# Patient Record
Sex: Female | Born: 1940 | Race: White | Hispanic: No | Marital: Married | Smoking: Current every day smoker
Health system: Southern US, Community
[De-identification: ages and names within clinical notes are randomized; demographics above are authoritative.]

## PROBLEM LIST (undated history)

## (undated) DIAGNOSIS — R112 Nausea with vomiting, unspecified: Secondary | ICD-10-CM

## (undated) DIAGNOSIS — I739 Peripheral vascular disease, unspecified: Secondary | ICD-10-CM

## (undated) DIAGNOSIS — R600 Localized edema: Secondary | ICD-10-CM

## (undated) DIAGNOSIS — T4145XA Adverse effect of unspecified anesthetic, initial encounter: Secondary | ICD-10-CM

## (undated) DIAGNOSIS — IMO0001 Reserved for inherently not codable concepts without codable children: Secondary | ICD-10-CM

## (undated) DIAGNOSIS — N3941 Urge incontinence: Secondary | ICD-10-CM

## (undated) DIAGNOSIS — E785 Hyperlipidemia, unspecified: Secondary | ICD-10-CM

## (undated) DIAGNOSIS — J449 Chronic obstructive pulmonary disease, unspecified: Secondary | ICD-10-CM

## (undated) DIAGNOSIS — Z5189 Encounter for other specified aftercare: Secondary | ICD-10-CM

## (undated) DIAGNOSIS — I10 Essential (primary) hypertension: Secondary | ICD-10-CM

## (undated) DIAGNOSIS — T7840XA Allergy, unspecified, initial encounter: Secondary | ICD-10-CM

## (undated) DIAGNOSIS — R21 Rash and other nonspecific skin eruption: Secondary | ICD-10-CM

## (undated) DIAGNOSIS — M1990 Unspecified osteoarthritis, unspecified site: Secondary | ICD-10-CM

## (undated) DIAGNOSIS — C259 Malignant neoplasm of pancreas, unspecified: Secondary | ICD-10-CM

## (undated) DIAGNOSIS — I6529 Occlusion and stenosis of unspecified carotid artery: Secondary | ICD-10-CM

## (undated) DIAGNOSIS — Z9049 Acquired absence of other specified parts of digestive tract: Secondary | ICD-10-CM

## (undated) DIAGNOSIS — E0590 Thyrotoxicosis, unspecified without thyrotoxic crisis or storm: Secondary | ICD-10-CM

## (undated) DIAGNOSIS — Z9889 Other specified postprocedural states: Secondary | ICD-10-CM

## (undated) DIAGNOSIS — I779 Disorder of arteries and arterioles, unspecified: Secondary | ICD-10-CM

## (undated) DIAGNOSIS — T8859XA Other complications of anesthesia, initial encounter: Secondary | ICD-10-CM

## (undated) HISTORY — DX: Thyrotoxicosis, unspecified without thyrotoxic crisis or storm: E05.90

## (undated) HISTORY — DX: Chronic obstructive pulmonary disease, unspecified: J44.9

## (undated) HISTORY — DX: Hyperlipidemia, unspecified: E78.5

## (undated) HISTORY — DX: Occlusion and stenosis of unspecified carotid artery: I65.29

## (undated) HISTORY — DX: Localized edema: R60.0

## (undated) HISTORY — DX: Acquired absence of other specified parts of digestive tract: Z90.49

## (undated) HISTORY — DX: Other specified postprocedural states: Z98.890

## (undated) HISTORY — PX: TONSILLECTOMY: SUR1361

## (undated) HISTORY — DX: Hypercalcemia: E83.52

## (undated) HISTORY — DX: Urge incontinence: N39.41

## (undated) HISTORY — DX: Disorder of arteries and arterioles, unspecified: I77.9

## (undated) HISTORY — DX: Peripheral vascular disease, unspecified: I73.9

## (undated) HISTORY — PX: EYE SURGERY: SHX253

## (undated) HISTORY — DX: Unspecified osteoarthritis, unspecified site: M19.90

## (undated) HISTORY — DX: Essential (primary) hypertension: I10

## (undated) HISTORY — PX: TUBAL LIGATION: SHX77

---
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
## 2000-11-27 ENCOUNTER — Other Ambulatory Visit: Admission: RE | Admit: 2000-11-27 | Discharge: 2000-11-27 | Payer: Self-pay | Admitting: Internal Medicine

## 2001-11-02 ENCOUNTER — Encounter: Admission: RE | Admit: 2001-11-02 | Discharge: 2002-01-31 | Payer: Self-pay | Admitting: Internal Medicine

## 2003-06-06 ENCOUNTER — Encounter: Admission: RE | Admit: 2003-06-06 | Discharge: 2003-06-06 | Payer: Self-pay | Admitting: Family Medicine

## 2003-07-03 ENCOUNTER — Encounter: Admission: RE | Admit: 2003-07-03 | Discharge: 2003-10-01 | Payer: Self-pay | Admitting: Internal Medicine

## 2004-07-10 ENCOUNTER — Ambulatory Visit: Payer: Self-pay | Admitting: Internal Medicine

## 2004-08-06 ENCOUNTER — Ambulatory Visit: Payer: Self-pay | Admitting: Internal Medicine

## 2004-10-07 ENCOUNTER — Ambulatory Visit: Payer: Self-pay | Admitting: Internal Medicine

## 2004-12-12 ENCOUNTER — Ambulatory Visit: Payer: Self-pay | Admitting: Internal Medicine

## 2004-12-24 ENCOUNTER — Ambulatory Visit: Payer: Self-pay | Admitting: Internal Medicine

## 2005-02-10 ENCOUNTER — Ambulatory Visit: Payer: Self-pay | Admitting: Internal Medicine

## 2005-02-17 ENCOUNTER — Ambulatory Visit: Payer: Self-pay | Admitting: Internal Medicine

## 2005-07-16 ENCOUNTER — Ambulatory Visit: Payer: Self-pay | Admitting: Internal Medicine

## 2005-07-23 ENCOUNTER — Ambulatory Visit: Payer: Self-pay | Admitting: Internal Medicine

## 2005-08-06 ENCOUNTER — Ambulatory Visit: Payer: Self-pay

## 2005-09-05 ENCOUNTER — Ambulatory Visit: Payer: Self-pay | Admitting: Internal Medicine

## 2005-09-16 ENCOUNTER — Ambulatory Visit: Payer: Self-pay | Admitting: Internal Medicine

## 2006-01-02 ENCOUNTER — Ambulatory Visit: Payer: Self-pay | Admitting: Internal Medicine

## 2006-01-02 LAB — CONVERTED CEMR LAB
ALT: 26 units/L (ref 0–40)
Chol/HDL Ratio, serum: 2.9
Cholesterol: 144 mg/dL (ref 0–200)
TSH: 1.44 microintl units/mL (ref 0.35–5.50)
VLDL: 21 mg/dL (ref 0–40)

## 2006-01-09 ENCOUNTER — Ambulatory Visit: Payer: Self-pay | Admitting: Internal Medicine

## 2006-03-24 ENCOUNTER — Ambulatory Visit: Payer: Self-pay | Admitting: Gastroenterology

## 2006-04-08 ENCOUNTER — Ambulatory Visit: Payer: Self-pay | Admitting: Gastroenterology

## 2006-05-15 ENCOUNTER — Ambulatory Visit: Payer: Self-pay | Admitting: Internal Medicine

## 2006-05-15 LAB — CONVERTED CEMR LAB: Hgb A1c MFr Bld: 7.1 % — ABNORMAL HIGH (ref 4.6–6.0)

## 2006-05-22 ENCOUNTER — Ambulatory Visit: Payer: Self-pay | Admitting: Internal Medicine

## 2006-09-08 ENCOUNTER — Ambulatory Visit: Payer: Self-pay | Admitting: Internal Medicine

## 2006-09-08 LAB — CONVERTED CEMR LAB
CO2: 32 meq/L (ref 19–32)
Chloride: 111 meq/L (ref 96–112)
Creatinine, Ser: 0.6 mg/dL (ref 0.4–1.2)
GFR calc non Af Amer: 107 mL/min
Glucose, Bld: 106 mg/dL — ABNORMAL HIGH (ref 70–99)
Potassium: 4.2 meq/L (ref 3.5–5.1)
Sodium: 145 meq/L (ref 135–145)

## 2006-09-15 ENCOUNTER — Ambulatory Visit: Payer: Self-pay | Admitting: Internal Medicine

## 2006-09-18 DIAGNOSIS — E785 Hyperlipidemia, unspecified: Secondary | ICD-10-CM

## 2006-09-18 DIAGNOSIS — R0989 Other specified symptoms and signs involving the circulatory and respiratory systems: Secondary | ICD-10-CM | POA: Insufficient documentation

## 2006-09-18 DIAGNOSIS — I739 Peripheral vascular disease, unspecified: Secondary | ICD-10-CM | POA: Insufficient documentation

## 2006-10-16 ENCOUNTER — Ambulatory Visit: Payer: Self-pay | Admitting: Internal Medicine

## 2006-10-16 ENCOUNTER — Encounter: Payer: Self-pay | Admitting: Internal Medicine

## 2006-11-27 ENCOUNTER — Encounter: Payer: Self-pay | Admitting: Internal Medicine

## 2007-01-05 ENCOUNTER — Ambulatory Visit: Payer: Self-pay | Admitting: Vascular Surgery

## 2007-01-05 ENCOUNTER — Encounter: Payer: Self-pay | Admitting: Internal Medicine

## 2007-01-15 ENCOUNTER — Ambulatory Visit: Payer: Self-pay | Admitting: Internal Medicine

## 2007-01-18 LAB — CONVERTED CEMR LAB
Calcium: 10.5 mg/dL (ref 8.4–10.5)
Chloride: 102 meq/L (ref 96–112)
Cholesterol: 158 mg/dL (ref 0–200)
Creatinine, Ser: 0.6 mg/dL (ref 0.4–1.2)
HDL: 51.2 mg/dL (ref >39.0)
Triglycerides: 107 mg/dL (ref 0–149)
VLDL: 21 mg/dL (ref 0–40)

## 2007-01-22 ENCOUNTER — Ambulatory Visit: Payer: Self-pay | Admitting: Internal Medicine

## 2007-01-22 DIAGNOSIS — F17200 Nicotine dependence, unspecified, uncomplicated: Secondary | ICD-10-CM | POA: Insufficient documentation

## 2007-01-22 DIAGNOSIS — E119 Type 2 diabetes mellitus without complications: Secondary | ICD-10-CM | POA: Insufficient documentation

## 2007-01-22 DIAGNOSIS — I10 Essential (primary) hypertension: Secondary | ICD-10-CM | POA: Insufficient documentation

## 2007-01-22 LAB — CONVERTED CEMR LAB
Cholesterol, target level: 200 mg/dL
HDL goal, serum: 40 mg/dL
LDL Goal: 70 mg/dL

## 2007-01-25 ENCOUNTER — Telehealth: Payer: Self-pay | Admitting: *Deleted

## 2007-05-14 ENCOUNTER — Ambulatory Visit: Payer: Self-pay | Admitting: Internal Medicine

## 2007-05-14 LAB — CONVERTED CEMR LAB: Vit D, 1,25-Dihydroxy: 49

## 2007-05-20 LAB — CONVERTED CEMR LAB
Hgb A1c MFr Bld: 6.6 % — ABNORMAL HIGH (ref 4.6–6.0)
Microalb Creat Ratio: 5.6 mg/g (ref 0.0–30.0)
Microalb, Ur: 0.8 mg/dL (ref 0.0–1.9)

## 2007-05-28 ENCOUNTER — Ambulatory Visit: Payer: Self-pay | Admitting: Internal Medicine

## 2007-05-28 DIAGNOSIS — M25569 Pain in unspecified knee: Secondary | ICD-10-CM

## 2007-06-29 ENCOUNTER — Ambulatory Visit: Payer: Self-pay | Admitting: Vascular Surgery

## 2007-09-22 ENCOUNTER — Ambulatory Visit: Payer: Self-pay | Admitting: Internal Medicine

## 2007-09-22 LAB — CONVERTED CEMR LAB
Chloride: 104 meq/L (ref 96–112)
Creatinine,U: 67.4 mg/dL
Hgb A1c MFr Bld: 6.8 % — ABNORMAL HIGH (ref 4.6–6.0)
Microalb Creat Ratio: 3 mg/g (ref 0.0–30.0)
Microalb, Ur: 0.2 mg/dL (ref 0.0–1.9)
Sodium: 144 meq/L (ref 135–145)
TSH: 1.5 microintl units/mL (ref 0.35–5.50)

## 2007-09-29 ENCOUNTER — Ambulatory Visit: Payer: Self-pay | Admitting: Internal Medicine

## 2007-09-29 DIAGNOSIS — B009 Herpesviral infection, unspecified: Secondary | ICD-10-CM | POA: Insufficient documentation

## 2007-10-04 ENCOUNTER — Encounter: Payer: Self-pay | Admitting: Internal Medicine

## 2007-11-22 ENCOUNTER — Telehealth: Payer: Self-pay | Admitting: Internal Medicine

## 2007-12-08 ENCOUNTER — Encounter: Payer: Self-pay | Admitting: Internal Medicine

## 2007-12-16 ENCOUNTER — Encounter: Payer: Self-pay | Admitting: Internal Medicine

## 2007-12-31 ENCOUNTER — Ambulatory Visit: Payer: Self-pay | Admitting: Internal Medicine

## 2007-12-31 LAB — CONVERTED CEMR LAB
ALT: 30 units/L (ref 0–35)
AST: 21 units/L (ref 0–37)
Albumin: 4.1 g/dL (ref 3.5–5.2)
Alkaline Phosphatase: 68 units/L (ref 39–117)
Bilirubin, Direct: 0.1 mg/dL (ref 0.0–0.3)
Cholesterol: 161 mg/dL (ref 0–200)
HDL: 50 mg/dL (ref >39.0)
Hgb A1c MFr Bld: 6.8 % — ABNORMAL HIGH (ref 4.6–6.0)
LDL Cholesterol: 91 mg/dL (ref 0–99)
Total Bilirubin: 0.8 mg/dL (ref 0.3–1.2)
Total CHOL/HDL Ratio: 3.2
Total Protein: 6.8 g/dL (ref 6.0–8.3)
Triglycerides: 101 mg/dL (ref 0–149)
VLDL: 20 mg/dL (ref 0–40)

## 2008-01-04 ENCOUNTER — Ambulatory Visit: Payer: Self-pay | Admitting: Vascular Surgery

## 2008-01-20 ENCOUNTER — Ambulatory Visit: Payer: Self-pay | Admitting: Internal Medicine

## 2008-01-21 ENCOUNTER — Telehealth: Payer: Self-pay | Admitting: *Deleted

## 2008-05-19 ENCOUNTER — Ambulatory Visit: Payer: Self-pay | Admitting: Internal Medicine

## 2008-05-19 LAB — CONVERTED CEMR LAB: Hgb A1c MFr Bld: 6.8 % — ABNORMAL HIGH (ref 4.6–6.0)

## 2008-05-26 ENCOUNTER — Ambulatory Visit: Payer: Self-pay | Admitting: Internal Medicine

## 2008-05-26 LAB — CONVERTED CEMR LAB: Blood Glucose, Fingerstick: 169

## 2008-06-01 ENCOUNTER — Telehealth: Payer: Self-pay | Admitting: Internal Medicine

## 2008-06-27 ENCOUNTER — Ambulatory Visit: Payer: Self-pay | Admitting: Vascular Surgery

## 2008-11-14 ENCOUNTER — Ambulatory Visit: Payer: Self-pay | Admitting: Internal Medicine

## 2008-11-14 ENCOUNTER — Encounter: Payer: Self-pay | Admitting: Internal Medicine

## 2008-11-15 ENCOUNTER — Ambulatory Visit: Payer: Self-pay | Admitting: Internal Medicine

## 2008-11-15 LAB — CONVERTED CEMR LAB
ALT: 28 units/L (ref 0–35)
Albumin: 4.1 g/dL (ref 3.5–5.2)
Basophils Absolute: 0 10*3/uL (ref 0.0–0.1)
Basophils Relative: 0.8 % (ref 0.0–3.0)
Bilirubin, Direct: 0 mg/dL (ref 0.0–0.3)
CO2: 32 meq/L (ref 19–32)
Chloride: 104 meq/L (ref 96–112)
Eosinophils Absolute: 0.2 10*3/uL (ref 0.0–0.7)
Eosinophils Relative: 4.6 % (ref 0.0–5.0)
GFR calc non Af Amer: 88.46 mL/min (ref >60)
HCT: 40.6 % (ref 36.0–46.0)
Hgb A1c MFr Bld: 6.6 % — ABNORMAL HIGH (ref 4.6–6.5)
LDL Cholesterol: 89 mg/dL (ref 0–99)
Lymphs Abs: 1.3 10*3/uL (ref 0.7–4.0)
MCHC: 34.1 g/dL (ref 30.0–36.0)
Neutro Abs: 2.9 10*3/uL (ref 1.4–7.7)
Sodium: 142 meq/L (ref 135–145)
TSH: 1.25 microintl units/mL (ref 0.35–5.50)
Total Bilirubin: 0.8 mg/dL (ref 0.3–1.2)
Total CHOL/HDL Ratio: 3
Total Protein: 6.5 g/dL (ref 6.0–8.3)
VLDL: 17.2 mg/dL (ref 0.0–40.0)

## 2008-11-17 ENCOUNTER — Telehealth: Payer: Self-pay | Admitting: Internal Medicine

## 2008-12-20 ENCOUNTER — Encounter: Payer: Self-pay | Admitting: Internal Medicine

## 2008-12-26 ENCOUNTER — Ambulatory Visit: Payer: Self-pay | Admitting: Internal Medicine

## 2009-01-02 ENCOUNTER — Ambulatory Visit: Payer: Self-pay | Admitting: Vascular Surgery

## 2009-06-19 ENCOUNTER — Ambulatory Visit: Payer: Self-pay | Admitting: Internal Medicine

## 2009-06-19 LAB — CONVERTED CEMR LAB: Hgb A1c MFr Bld: 7.4 % — ABNORMAL HIGH (ref 4.6–6.5)

## 2009-06-26 ENCOUNTER — Ambulatory Visit: Payer: Self-pay | Admitting: Internal Medicine

## 2009-06-27 ENCOUNTER — Encounter: Payer: Self-pay | Admitting: Internal Medicine

## 2009-06-27 ENCOUNTER — Ambulatory Visit: Payer: Self-pay | Admitting: Vascular Surgery

## 2009-09-25 ENCOUNTER — Ambulatory Visit: Payer: Self-pay | Admitting: Internal Medicine

## 2009-10-02 ENCOUNTER — Ambulatory Visit: Payer: Self-pay | Admitting: Internal Medicine

## 2009-12-26 ENCOUNTER — Encounter: Payer: Self-pay | Admitting: Internal Medicine

## 2010-01-30 ENCOUNTER — Ambulatory Visit: Payer: Self-pay | Admitting: Internal Medicine

## 2010-01-30 LAB — CONVERTED CEMR LAB
ALT: 15 units/L (ref 0–35)
Albumin: 4.5 g/dL (ref 3.5–5.2)
BUN: 23 mg/dL (ref 6–23)
Basophils Relative: 0.6 % (ref 0.0–3.0)
Bilirubin, Direct: 0.1 mg/dL (ref 0.0–0.3)
Creatinine, Ser: 0.74 mg/dL (ref 0.40–1.20)
Eosinophils Relative: 3.7 % (ref 0.0–5.0)
HDL: 52 mg/dL (ref >39)
Lymphocytes Relative: 28 % (ref 12.0–46.0)
Lymphs Abs: 1.9 10*3/uL (ref 0.7–4.0)
MCV: 93.3 fL (ref 78.0–100.0)
Monocytes Absolute: 0.6 10*3/uL (ref 0.1–1.0)
Monocytes Relative: 8.9 % (ref 3.0–12.0)
RBC: 4.41 M/uL (ref 3.87–5.11)
Total Bilirubin: 0.6 mg/dL (ref 0.3–1.2)
Total CHOL/HDL Ratio: 3.3
Triglycerides: 161 mg/dL — ABNORMAL HIGH (ref <150)
VLDL: 32 mg/dL (ref 0–40)
WBC: 6.9 10*3/uL (ref 4.5–10.5)

## 2010-02-06 ENCOUNTER — Ambulatory Visit: Payer: Self-pay | Admitting: Internal Medicine

## 2010-02-06 DIAGNOSIS — M899 Disorder of bone, unspecified: Secondary | ICD-10-CM | POA: Insufficient documentation

## 2010-02-06 DIAGNOSIS — M949 Disorder of cartilage, unspecified: Secondary | ICD-10-CM

## 2010-03-25 ENCOUNTER — Encounter: Payer: Self-pay | Admitting: Gastroenterology
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
## 2010-04-11 NOTE — Letter (Signed)
Summary: Colonoscopy Date Change Letter  [facility]  [address] [place], [place] [address]   Phone: [phone]  Fax: [phone]      March 25, 2010 MRN: [id number]   REBBECCA OSUNA [address] [place], [place]  [address]   Dear Ms. Chlebowski,   Previously you were recommended to have a repeat colonoscopy around this time. Your chart was recently reviewed by DR. KAPLAN of [facility]. Follow up colonoscopy is now recommended in 03-2016. This revised recommendation is based on current, nationally recognized guidelines for colorectal cancer screening and polyp surveillance. These guidelines are endorsed by the American Cancer Society, [employer] on Colorectal Cancer as well as numerous other major medical organizations.  Please understand that our recommendation assumes that you do not have any new symptoms such as bleeding, a change in bowel habits, anemia, or significant abdominal discomfort. If you do have any concerning GI symptoms or want to discuss the guideline recommendations, please call to arrange an office visit at your earliest convenience. Otherwise we will keep you in our reminder system and contact you 1-2 months prior to the date listed above to schedule your next colonoscopy.  Thank you,  Barbette Hair. Arlyce Dice, M.D.  [facility] [phone]

## 2010-04-29 ENCOUNTER — Telehealth: Payer: Self-pay | Admitting: Internal Medicine

## 2010-04-29 NOTE — Telephone Encounter (Signed)
Pt has a cold for about 2 wk she is requesting abx call into [facility] [phone]

## 2010-04-30 ENCOUNTER — Telehealth: Payer: Self-pay | Admitting: *Deleted

## 2010-04-30 NOTE — Telephone Encounter (Signed)
Called pt to call back for an appt for an URI>

## 2010-04-30 NOTE — Telephone Encounter (Signed)
Pt has had sinus complaints x 10 days.

## 2010-04-30 NOTE — Telephone Encounter (Signed)
Per Dr. Fabian Sharp- Sounds viral, How long has she had these symptoms, if she gets a fever, severe pain or not improving after 10-14 days the needs a office visit for evaluation.

## 2010-04-30 NOTE — Telephone Encounter (Signed)
Runny nose, scratchy throat, no cough, no drainage.  Has been taking Sudafed with now relief. [facility] [place].  No fever. Wants antibiotic and no appt.

## 2010-04-30 NOTE — Telephone Encounter (Signed)
As in notes, symptoms have been going on x 10 days.  No facial pain or fever.......just symptoms in previous notes.  Does not want an office visit. Do you want any treatment?????

## 2010-05-14 NOTE — Telephone Encounter (Signed)
This call was 04/29/2010, and she is well.

## 2010-05-18 ENCOUNTER — Other Ambulatory Visit: Payer: Self-pay | Admitting: Internal Medicine

## 2010-05-21 ENCOUNTER — Telehealth: Payer: Self-pay | Admitting: Internal Medicine

## 2010-05-21 NOTE — Telephone Encounter (Signed)
Appt made

## 2010-05-21 NOTE — Telephone Encounter (Signed)
Left message to call back  

## 2010-05-21 NOTE — Telephone Encounter (Signed)
Attempted to speak to pt about her symptoms, and she informed me she has spoken to 3 people here, and if she died, she would just die.  Very upset and hung up.  Attempted to reason with her to no avail.

## 2010-05-21 NOTE — Telephone Encounter (Signed)
Unsure what i should do with this message   . Ok to work her in tomorrow am at   9 30 if she wants to come.

## 2010-05-21 NOTE — Telephone Encounter (Signed)
Pt req work in appt for Dr Fabian Sharp tomorrow re: cough/chest and head congestion. Pls advise.

## 2010-05-22 ENCOUNTER — Ambulatory Visit (INDEPENDENT_AMBULATORY_CARE_PROVIDER_SITE_OTHER): Payer: PRIVATE HEALTH INSURANCE | Admitting: Internal Medicine

## 2010-05-22 ENCOUNTER — Encounter: Payer: Self-pay | Admitting: Internal Medicine

## 2010-05-22 VITALS — BP 140/70 | HR 74 | Temp 97.9°F | Wt 138.0 lb

## 2010-05-22 DIAGNOSIS — F17200 Nicotine dependence, unspecified, uncomplicated: Secondary | ICD-10-CM

## 2010-05-22 DIAGNOSIS — J209 Acute bronchitis, unspecified: Secondary | ICD-10-CM

## 2010-05-22 DIAGNOSIS — J449 Chronic obstructive pulmonary disease, unspecified: Secondary | ICD-10-CM

## 2010-05-22 DIAGNOSIS — J019 Acute sinusitis, unspecified: Secondary | ICD-10-CM

## 2010-05-22 DIAGNOSIS — E785 Hyperlipidemia, unspecified: Secondary | ICD-10-CM

## 2010-05-22 DIAGNOSIS — I779 Disorder of arteries and arterioles, unspecified: Secondary | ICD-10-CM | POA: Insufficient documentation

## 2010-05-22 DIAGNOSIS — I739 Peripheral vascular disease, unspecified: Secondary | ICD-10-CM | POA: Insufficient documentation

## 2010-05-22 MED ORDER — HYDROCODONE-HOMATROPINE 5-1.5 MG/5ML PO SYRP: 5.0000 mL | ORAL_SOLUTION | ORAL | Status: AC | PRN

## 2010-05-22 MED ORDER — AMOXICILLIN-POT CLAVULANATE 875-125 MG PO TABS: 1.0000 | ORAL_TABLET | Freq: Two times a day (BID) | ORAL | Status: AC

## 2010-05-22 NOTE — Progress Notes (Signed)
  Subjective:    Patient ID: Jordan [name], female    DOB: 02-23-1941, 70 y.o.   MRN: [id number]  HPI  patient comes in today for an acute visit see phone notes. She had the onset of what was like a head cold about 4 weeks ago that has waxed and waned since that time. She currently has a mild headache but no face pain but has significant nasal congestion and discharge. A week ago she began to have a cough that became deeper and wheezy like she denies chills or shortness of breath at present. No hemoptysis the phlegm is dark gray and thick at times. Her coughing increases at night.  Past Medical History  Diagnosis Date  . Hyperlipidemia   . COPD (chronic obstructive pulmonary disease)     PFT's 2005 mod reversible defect  . Leg edema   . PVD (peripheral vascular disease)     claudication ABI.60 repeat 2011 left .84  . Urge incontinence   . Carotid arterial disease     left 60-79% fall 2009 left repeat 2011 same category   No past surgical history on file.  reports that she has been smoking.  She does not have any smokeless tobacco history on file. She reports that she drinks alcohol. Her drug history not on file. family history includes Colon polyps in her brother and mother. Allergies  Allergen Reactions  . Prednisone     Review of Systems  no chest pain shortness of breath otherwise change in health no one is sick at home. No history of seasonal allergies in the past. No Gi sx rest [place] see hpi    Objective:   Physical Exam  well-developed well-nourished in no acute distress very congested in the head with an occasional deep bronchial cough. HEENT: Normocephalic ;atraumatic , Eyes;  PERRL, EOMs  Full, lids and conjunctiva clear,,Ears: no deformities, canals nl, TM landmarks normal, Nose: 2 + congested   Mucoid dc face non tender Mouth : OP clear without lesion or edema . Neck no masses  Chest:  Clear to A&P without wheezes rales or rhonchi ? Slight dec BS  CV:  S1-S2 no gallops  or murmurs peripheral perfusion is normal No clubbing cyanosis or edema  Neuro no focal      Assessment & Plan:   Prolonged upper respiratory infection sinusitis and secondary bronchitis.  Tobacco and mild COPD.   Reviewed treatment expectant management antibiotic cough medicine as needed and to followup with alarm findings or persistent symptoms. Chest x-ray not needed at this time.

## 2010-05-22 NOTE — Patient Instructions (Signed)
Take antibiotic for sinusitis bronchitis infection Cough med for comfort  Can take 2 tsp 10 cc  if needed Stop tobacco Expect improvement in the next 5 days  . Call if not improving by then or prn.

## 2010-07-04 ENCOUNTER — Ambulatory Visit: Payer: Self-pay | Admitting: Vascular Surgery

## 2010-07-04 ENCOUNTER — Other Ambulatory Visit: Payer: Self-pay

## 2010-07-17 ENCOUNTER — Other Ambulatory Visit (INDEPENDENT_AMBULATORY_CARE_PROVIDER_SITE_OTHER): Payer: PRIVATE HEALTH INSURANCE | Admitting: Internal Medicine

## 2010-07-17 DIAGNOSIS — E119 Type 2 diabetes mellitus without complications: Secondary | ICD-10-CM

## 2010-07-17 LAB — MICROALBUMIN / CREATININE URINE RATIO
Creatinine,U: 47.9 mg/dL
Microalb Creat Ratio: 2.1 mg/g (ref 0.0–30.0)

## 2010-07-23 ENCOUNTER — Encounter: Payer: Self-pay | Admitting: Internal Medicine

## 2010-07-23 NOTE — Procedures (Signed)
CAROTID DUPLEX EXAM   INDICATION:  Followup carotid artery disease.   HISTORY:  Diabetes:  Yes.  Cardiac:  No.  Hypertension:  Yes.  Smoking:  Yes.  Previous Surgery:  No.  CV History:  No.  Amaurosis Fugax No, Paresthesias No, Hemiparesis No                                       RIGHT             LEFT  Brachial systolic pressure:         162               160  Brachial Doppler waveforms:         Biphasic          Biphasic  Vertebral direction of flow:        Antegrade         Antegrade  DUPLEX VELOCITIES (cm/sec)  CCA peak systolic                   74                84  ECA peak systolic                   138               114  ICA peak systolic                   97                319  ICA end diastolic                   27                84  PLAQUE MORPHOLOGY:                  Mixed             Calcified  PLAQUE AMOUNT:                      Mild              Moderate/severe  PLAQUE LOCATION:                    ICA/ECA           ICA/ECA   IMPRESSION:  1. Right ICA shows evidence of 20-39% stenosis, showing no significant      change from previous study.  2. Left ICA shows evidence of 60-79% stenosis with an increase in      velocity from previous study, however no category change.   ___________________________________________  Di Kindle. Edilia Bo, M.D.   AS/MEDQ  D:  01/04/2008  T:  01/04/2008  Job:  [id number]

## 2010-07-23 NOTE — Procedures (Signed)
CAROTID DUPLEX EXAM   INDICATION:  Carotid disease.   HISTORY:  Diabetes:  Yes.  Cardiac:  No.  Hypertension:  Yes.  Smoking:  Yes.  Previous Surgery:  No.  CV History:  Currently asymptomatic.  Amaurosis Fugax No, Paresthesias No, Hemiparesis No.                                       RIGHT             LEFT  Brachial systolic pressure:         145               147  Brachial Doppler waveforms:         Normal            Normal  Vertebral direction of flow:        Antegrade         Antegrade  DUPLEX VELOCITIES (cm/sec)  CCA peak systolic                   82                81  ECA peak systolic                   133               134  ICA peak systolic                   77                271  ICA end diastolic                   19                57  PLAQUE MORPHOLOGY:                  Mixed             Calcific  PLAQUE AMOUNT:                      Mild              Moderate/severe  PLAQUE LOCATION:                    ICA/ECA           ICA/ECA   IMPRESSION:  1. 1% to 39% stenosis of the right internal carotid artery.  2. 60% to 79% stenosis of the left internal carotid artery.  3. No significant change noted when compared to the previous      examination on 01/02/2009.   ___________________________________________  Janetta Hora. Fields, MD   CH/MEDQ  D:  06/27/2009  T:  06/27/2009  Job:  [id number]

## 2010-07-23 NOTE — Procedures (Signed)
CAROTID DUPLEX EXAM   INDICATION:  Carotid disease.   HISTORY:  Diabetes:  Yes.  Cardiac:  No.  Hypertension:  Yes.  Smoking:  Yes.  Previous Surgery:  No.  CV History:  Asymptomatic.  Amaurosis Fugax No, Paresthesias No, Hemiparesis No.                                       RIGHT             LEFT  Brachial systolic pressure:         156               152  Brachial Doppler waveforms:         Normal            Normal  Vertebral direction of flow:        Antegrade         Antegrade  DUPLEX VELOCITIES (cm/sec)  CCA peak systolic                   86                95  ECA peak systolic                   112               133  ICA peak systolic                   67                295  ICA end diastolic                   18                72  PLAQUE MORPHOLOGY:                  Mixed             Mixed  PLAQUE AMOUNT:                      Mild              Moderate  PLAQUE LOCATION:                    ICA/ECA           ICA/ECA   IMPRESSION:  1. A 1% to 39% stenosis of the right internal carotid artery.  2. High end 60% to 79% stenosis of the left internal carotid artery.  3. No significant change noted when compared to the previous exam on      01/04/2008.   ___________________________________________  Di Kindle. Edilia Bo, M.D.   CH/MEDQ  D:  06/27/2008  T:  06/27/2008  Job:  [id number]

## 2010-07-23 NOTE — Procedures (Signed)
CAROTID DUPLEX EXAM   INDICATION:  Follow up carotid artery disease   HISTORY:  Diabetes:  yes  Cardiac:  no  Hypertension:  yes  Smoking:  yes  Previous Surgery:  no  CV History:  Asymptomatic.  Amaurosis Fugax No, Paresthesias No, Hemiparesis No                                       RIGHT             LEFT  Brachial systolic pressure:         162               168  Brachial Doppler waveforms:         WNL               WNL  Vertebral direction of flow:        antegrade         antegrade  DUPLEX VELOCITIES (cm/sec)  CCA peak systolic                   96                87  ECA peak systolic                   119               149  ICA peak systolic                   105               306  ICA end diastolic                   29                66  PLAQUE MORPHOLOGY:                  mixed             calcified  PLAQUE AMOUNT:                      mild              moderate/severe  PLAQUE LOCATION:                    ICA/ECA           ICA/ECA   IMPRESSION:  1. Right internal carotid artery shows evidence of 1% to 39% stenosis.  2. Left internal carotid artery shows evidence of 60% to 79% stenosis.  3. No significant changes from previous study.   ___________________________________________  Di Kindle. Edilia Bo, M.D.   AS/MEDQ  D:  01/02/2009  T:  01/03/2009  Job:  [id number]

## 2010-07-23 NOTE — Assessment & Plan Note (Signed)
OFFICE VISIT   Jordan [name], Jordan [name]  DOB:  1940-05-18                                       01/05/2007  EAVWU#:[id number]   I saw the patient in the office today for continued followup of her  carotid disease and peripheral vascular disease.  Since I last saw her a  year ago, she had stable claudication in the left leg, which mostly  involves her calf and to some degree her thigh.  She has had no rest  pain and no history of non-healing wounds.  She has no symptoms in the  right leg.  Her symptoms in the left leg have remained stable over the  last year.   With respect to her known left carotid stenosis, she has had no history  of stroke, TIA, expressive or receptive aphasia, or amaurosis fugax.  She is on aspirin.   REVIEW OF SYSTEMS:  CARDIAC:  She has had no chest pain, chest pressure,  palpitations, or arrhythmias.  PULMONARY:  She does admit to dyspnea on exertion.  She has had no  recent bronchitis, asthma, or wheezing.   SOCIAL HISTORY:  She does continue to smoke 1/2 pack per day of  cigarettes.   PHYSICAL EXAMINATION:  Blood pressure is 128/64, heart rate is 93.  She  has a left carotid bruit.  Lungs are clear bilaterally to auscultation.  On cardiac exam, she has a regular rate and rhythm.  She has palpable  femoral pulses and a palpable posterior tibial pulse on the right.  I  cannot palpate popliteal or pedal pulses on the left side.  Both feet  appear adequately perfused, and she has no ischemic ulcers.  Neurologic  exam is nonfocal.   Carotid duplex scan shows that she has a less than 39% right carotid  stenosis.  On the left side, she has a 60-79% stenosis.  The velocities  on the left have increased slightly over the last year, and for this  reason, I have recommended that we see her back in 6 months for a  followup duplex scan.   ABI in our office today were 95% on the right and 64% on the left.  These are unchanged compared with  the study a year ago.  She will need  followup ABI in 1 year, which we will arrange.   We have again discussed the importance of tobacco cessation, and I have  encouraged her to stay as active as possible.  I will plan on seeing her  back in 6 months with a followup duplex scan.  She does know to continue  taking her aspirin.   Di Kindle. Edilia Bo, M.D.  Electronically Signed   CSD/MEDQ  D:  01/05/2007  T:  01/06/2007  Job:  [id number]   cc:   Neta Mends. Fabian Sharp, MD

## 2010-07-23 NOTE — Procedures (Signed)
CAROTID DUPLEX EXAM   INDICATION:  Followup, carotid artery disease.   HISTORY:  Diabetes:  No.  Cardiac:  No.  Hypertension:  No.  Smoking:  One-half pack per day for 47 years.  Previous Surgery:  No.  CV History:  No.  Amaurosis Fugax No, Paresthesias No, Hemiparesis No                                       RIGHT             LEFT  Brachial systolic pressure:         148               140  Brachial Doppler waveforms:         Biphasic          Biphasic  Vertebral direction of flow:        Antegrade         Antegrade  DUPLEX VELOCITIES (cm/sec)  CCA peak systolic                   78                90  ECA peak systolic                   147               134  ICA peak systolic                   71                228  ICA end diastolic                   18                63  PLAQUE MORPHOLOGY:                  Calcified         Calcified  PLAQUE AMOUNT:                      Moderate          Moderate  PLAQUE LOCATION:                    ICA, ECA          ICA, ECA   IMPRESSION:  1. Bilateral external carotid artery stenoses.  2. 20-39% right internal carotid artery stenosis.  3. 60-79% left internal carotid artery stenosis.  4. Study essentially unchanged from 01/05/07.   ___________________________________________  Di Kindle. Edilia Bo, M.D.   DP/MEDQ  D:  06/29/2007  T:  06/29/2007  Job:  [id number]

## 2010-07-23 NOTE — Procedures (Signed)
CAROTID DUPLEX EXAM   INDICATION:  Follow up carotid artery disease.   HISTORY:  Diabetes:  No.  Cardiac:  No.  Hypertension:  No.  Smoking:  Yes, 1/2 pack per day for 46 years.  Previous Surgery:  No.  CV History:  No.  Amaurosis Fugax No, Paresthesias No, Hemiparesis No.                                       RIGHT             LEFT  Brachial systolic pressure:         140               151  Brachial Doppler waveforms:         Biphasic.         Biphasic.  Vertebral direction of flow:        Antegrade.        Antegrade.  DUPLEX VELOCITIES (cm/sec)  CCA peak systolic                   85                89  ECA peak systolic                   102               158  ICA peak systolic                   74                256  ICA end diastolic                   19                81  PLAQUE MORPHOLOGY:                  Mixed.            Calcified.  PLAQUE AMOUNT:                      Mild.             Moderate to large.  PLAQUE LOCATION:                    ICA.              ICA, ECA.   IMPRESSION:  1. Left external carotid artery stenosis.  2. 20 to 39% right internal carotid artery stenosis.  3. 60 to 79% left internal carotid artery stenosis (velocities are      slightly increased from previous exam).  4. Study essentially unchanged from December 31, 2005.   ___________________________________________  Di Kindle. Edilia Bo, M.D.   DP/MEDQ  D:  01/05/2007  T:  01/06/2007  Job:  [id number]

## 2010-07-24 ENCOUNTER — Encounter: Payer: Self-pay | Admitting: Internal Medicine

## 2010-07-24 ENCOUNTER — Ambulatory Visit (INDEPENDENT_AMBULATORY_CARE_PROVIDER_SITE_OTHER): Payer: PRIVATE HEALTH INSURANCE | Admitting: Internal Medicine

## 2010-07-24 VITALS — BP 120/60 | HR 78 | Wt 137.0 lb

## 2010-07-24 DIAGNOSIS — I6529 Occlusion and stenosis of unspecified carotid artery: Secondary | ICD-10-CM

## 2010-07-24 DIAGNOSIS — I739 Peripheral vascular disease, unspecified: Secondary | ICD-10-CM

## 2010-07-24 DIAGNOSIS — J449 Chronic obstructive pulmonary disease, unspecified: Secondary | ICD-10-CM

## 2010-07-24 DIAGNOSIS — E119 Type 2 diabetes mellitus without complications: Secondary | ICD-10-CM

## 2010-07-24 DIAGNOSIS — F17200 Nicotine dependence, unspecified, uncomplicated: Secondary | ICD-10-CM

## 2010-07-24 DIAGNOSIS — I10 Essential (primary) hypertension: Secondary | ICD-10-CM

## 2010-07-24 DIAGNOSIS — E041 Nontoxic single thyroid nodule: Secondary | ICD-10-CM

## 2010-07-24 DIAGNOSIS — E785 Hyperlipidemia, unspecified: Secondary | ICD-10-CM
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
## 2010-07-24 NOTE — Assessment & Plan Note (Signed)
No sx last tsh in November 2011 and normal Get thyroid ultrasound and fu as appropriate.

## 2010-07-24 NOTE — Assessment & Plan Note (Signed)
Controlled no neuropathy  Has   pvd .  Continue same

## 2010-07-24 NOTE — Patient Instructions (Signed)
Continue medications Someone will contact you about gettting thyroid ultrasound. If ok   Then plan wellness visit in 6 months . Will do labs at that visit .    Come fasting.

## 2010-07-25 ENCOUNTER — Other Ambulatory Visit (INDEPENDENT_AMBULATORY_CARE_PROVIDER_SITE_OTHER): Payer: Medicare Other

## 2010-07-25 ENCOUNTER — Ambulatory Visit (INDEPENDENT_AMBULATORY_CARE_PROVIDER_SITE_OTHER): Payer: Medicare Other | Admitting: Vascular Surgery

## 2010-07-25 DIAGNOSIS — I6529 Occlusion and stenosis of unspecified carotid artery: Secondary | ICD-10-CM

## 2010-07-26 NOTE — Assessment & Plan Note (Signed)
OFFICE VISIT  Jordan, [name] DOB:  1941-03-07                                       07/25/2010 [id number]#:[id number]  The patient is a 70 year old female who has been followed by my partner Dr. Edilia Bo since 2002 for moderate carotid stenosis and mild claudication symptoms.  She returns today for further followup.  She was last seen by Dr. Edilia Bo in October of 2008.  She was asymptomatic from carotid and really had minimal claudication symptoms that had not really changed.  She continues to deny any symptoms of TIA, amaurosis or stroke.  She is also quite satisfied with her walking distance and does not really complain of claudication at this point.  She has no history of rest pain.  She has no history of nonhealing wounds on the feet.  CHRONIC MEDICAL PROBLEMS:  Continue to remain diabetes and hypertension which are both followed by Dr. Fabian Sharp and well-controlled.  SOCIAL HISTORY:  She is married.  She is retired.  She has no children. She currently smokes a half pack of cigarettes per day.  She drinks a glass of wine on occasion.  FAMILY HISTORY:  Not remarkable for any early vascular disease.  REVIEW OF SYSTEMS:  She has shortness of breath with exertion.  She has multiple joint and arthritis pain.  All other systems are negative.  MEDICATIONS:  She is on aspirin for antiplatelet therapy.  PHYSICAL EXAM:  Vital signs:  Blood pressure 154/73 in the right arm, 159/67 in the left arm, heart rate 85 and regular.  HEENT: Unremarkable.  Neck:  Has 2+ carotid pulses with a right-sided bruit. Chest:  Clear to auscultation.  Cardiac:  Regular rate and rhythm without murmur.  Abdomen:  Soft, nontender, nondistended.  No masses. Extremities:  She has 2+ radial and 2+ femoral pulses bilaterally.  She has absent popliteal and pedal pulses bilaterally.  Musculoskeletal: Shows no major obvious joint deformities.  Neurologic:  Shows symmetric upper extremity  and lower extremity motor strength which is 5/5.  Skin: Has no open ulcers or rashes.  She had a repeat carotid duplex exam today which showed a 60% to 80% left internal carotid artery stenosis with a peak systolic velocity of 305 cm/s which is slightly increased from her previous duplex exam.  She still had minimal stenosis on the right side.  In summary, the patient has a moderate carotid stenosis on the left side but this has progressed somewhat.  She continues to really deny worsening symptoms of claudication.  As far as her carotid stenosis is concerned this has progressed somewhat since her last carotid duplex studies so I believe the best option would be to repeat her carotid duplex scan in 6 months' time.  She will continue to receive current duplex scans of her carotid.  If this progresses to greater than 80% or become symptomatic with symptoms of TIA, amaurosis or stroke we would consider carotid endarterectomy at that time.  Since the patient has been followed by Dr. Edilia Bo for a number of years we will try to make sure that her followup is arranged with him when she returns for subsequent office visits.    Janetta Hora. Crystelle Ferrufino, MD Electronically Signed  CEF/MEDQ  D:  07/25/2010  T:  07/26/2010  Job:  [id number]  cc:   Neta Mends. Fabian Sharp, MD

## 2010-07-30 ENCOUNTER — Ambulatory Visit
Admission: RE | Admit: 2010-07-30 | Discharge: 2010-07-30 | Disposition: A | Discharge status: Home / Self Care | Payer: Medicare Other | Source: Ambulatory Visit | Attending: Internal Medicine | Admitting: Internal Medicine

## 2010-07-30 DIAGNOSIS — E041 Nontoxic single thyroid nodule: Secondary | ICD-10-CM

## 2010-08-03 NOTE — Procedures (Unsigned)
CAROTID DUPLEX EXAM  INDICATION:  Follow up carotid disease.  HISTORY: Diabetes:  Yes. Cardiac:  No. Hypertension:  Yes. Smoking:  Yes. Previous Surgery:  No. CV History: Amaurosis Fugax No, Paresthesias No, Hemiparesis No.                                      RIGHT             LEFT Brachial systolic pressure:         150               142 Brachial Doppler waveforms:         WNL               WNL Vertebral direction of flow:        Antegrade         Antegrade DUPLEX VELOCITIES (cm/sec) CCA peak systolic                   76                64 ECA peak systolic                   91                112 ICA peak systolic                   70                305 ICA end diastolic                   19                72 PLAQUE MORPHOLOGY:                  Heterogenous      Heterogenous PLAQUE AMOUNT:                      Minimal           Moderate-to-severe PLAQUE LOCATION:                    CCA/ICA           CCA/ICA  IMPRESSION: 1. 1% to 39% plaquing of the right internal carotid artery. 2. 60% to 79% left internal carotid artery stenosis. 3. Antegrade and elevated bilateral vertebral arteries with velocities     of 117 cm/s on the right and 138 cm/s on the left.  ___________________________________________ Janetta Hora. Fields, MD  LT/MEDQ  D:  07/25/2010  T:  07/25/2010  Job:  [id number]

## 2010-08-07 ENCOUNTER — Telehealth: Payer: Self-pay | Admitting: *Deleted

## 2010-08-07 DIAGNOSIS — E042 Nontoxic multinodular goiter: Secondary | ICD-10-CM

## 2010-08-07 NOTE — Telephone Encounter (Signed)
Message copied by Romualdo Bolk on Wed Aug 07, 2010  3:23 PM ------      Message from: [facility], [place] [facility]      Created: Tue Aug 06, 2010  6:02 PM       Total patient that she has a number of small nodules in her thyroid .      The one I was feeling was slightly larger than  the others ..it was recommended we either do a needle biopsy or recheck ultrasound in 6 months.  Please advise her preference.  But it is very important we do followup imaging. eSpecially if we do not do the biopsy.

## 2010-08-09 ENCOUNTER — Telehealth: Payer: Self-pay | Admitting: *Deleted

## 2010-08-09 NOTE — Telephone Encounter (Signed)
error 

## 2010-08-12 ENCOUNTER — Other Ambulatory Visit: Payer: Self-pay | Admitting: Internal Medicine

## 2010-08-14 ENCOUNTER — Other Ambulatory Visit (HOSPITAL_COMMUNITY)
Admission: RE | Admit: 2010-08-14 | Discharge: 2010-08-14 | Disposition: A | Discharge status: Home / Self Care | Payer: Medicare Other | Source: Ambulatory Visit | Attending: Diagnostic Radiology | Admitting: Diagnostic Radiology

## 2010-08-14 ENCOUNTER — Other Ambulatory Visit: Payer: Self-pay | Admitting: Diagnostic Radiology

## 2010-08-14 ENCOUNTER — Ambulatory Visit
Admission: RE | Admit: 2010-08-14 | Discharge: 2010-08-14 | Disposition: A | Discharge status: Home / Self Care | Payer: Medicare Other | Source: Ambulatory Visit | Attending: Internal Medicine | Admitting: Internal Medicine

## 2010-08-14 DIAGNOSIS — E049 Nontoxic goiter, unspecified: Secondary | ICD-10-CM | POA: Insufficient documentation

## 2010-08-14 DIAGNOSIS — E042 Nontoxic multinodular goiter: Secondary | ICD-10-CM

## 2010-09-02 ENCOUNTER — Encounter: Payer: Self-pay | Admitting: Internal Medicine

## 2010-11-09 ENCOUNTER — Other Ambulatory Visit: Payer: Self-pay | Admitting: Internal Medicine

## 2010-12-20 ENCOUNTER — Encounter: Payer: Self-pay | Admitting: Vascular Surgery

## 2011-01-03 ENCOUNTER — Encounter: Payer: Self-pay | Admitting: Internal Medicine

## 2011-01-07 ENCOUNTER — Encounter (HOSPITAL_COMMUNITY)
Admission: RE | Admit: 2011-01-07 | Discharge: 2011-01-07 | Disposition: A | Discharge status: Home / Self Care | Payer: Medicare Other | Source: Ambulatory Visit | Attending: Neurosurgery | Admitting: Neurosurgery

## 2011-01-07 ENCOUNTER — Encounter (HOSPITAL_COMMUNITY): Payer: Self-pay

## 2011-01-07 ENCOUNTER — Ambulatory Visit (HOSPITAL_COMMUNITY)
Admission: RE | Admit: 2011-01-07 | Discharge: 2011-01-07 | Disposition: A | Discharge status: Home / Self Care | Payer: Medicare Other | Source: Ambulatory Visit | Attending: Neurosurgery | Admitting: Neurosurgery

## 2011-01-07 DIAGNOSIS — Z01812 Encounter for preprocedural laboratory examination: Secondary | ICD-10-CM | POA: Insufficient documentation

## 2011-01-07 DIAGNOSIS — Z01818 Encounter for other preprocedural examination: Secondary | ICD-10-CM | POA: Insufficient documentation

## 2011-01-07 DIAGNOSIS — Z01810 Encounter for preprocedural cardiovascular examination: Secondary | ICD-10-CM | POA: Insufficient documentation

## 2011-01-07 HISTORY — DX: Encounter for other specified aftercare: Z51.89

## 2011-01-07 HISTORY — DX: Other specified postprocedural states: Z98.890

## 2011-01-07 HISTORY — DX: Reserved for inherently not codable concepts without codable children: IMO0001

## 2011-01-07 HISTORY — DX: Nausea with vomiting, unspecified: R11.2

## 2011-01-07 LAB — BASIC METABOLIC PANEL
BUN: 24 mg/dL — ABNORMAL HIGH (ref 6–23)
Calcium: 11.9 mg/dL — ABNORMAL HIGH (ref 8.4–10.5)
GFR calc Af Amer: >90 mL/min (ref >90)
GFR calc non Af Amer: 87 mL/min — ABNORMAL LOW (ref >90)
Glucose, Bld: 97 mg/dL (ref 70–99)
Sodium: 142 mEq/L (ref 135–145)

## 2011-01-07 LAB — CBC
Hemoglobin: 15.2 g/dL — ABNORMAL HIGH (ref 12.0–15.0)
MCH: 31.6 pg (ref 26.0–34.0)
MCHC: 35.3 g/dL (ref 30.0–36.0)
RDW: 12.3 % (ref 11.5–15.5)

## 2011-01-07 LAB — SURGICAL PCR SCREEN: Staphylococcus aureus: NEGATIVE

## 2011-01-07 NOTE — Pre-Procedure Instructions (Signed)
20 Jordan [name]  01/07/2011   Your procedure is scheduled on:  01/15/11 wednesday  Report to [facility] at 7:45 AM.  Call this number if you have problems the morning of surgery: [phone]   Remember:   Do not eat food:After Midnight.  Do not drink clear liquids: 4 Hours before arrival.  Take these medicines the morning of surgery with A SIP OF WATER: gabapentin   Do not wear jewelry, make-up or nail polish.  Do not wear lotions, powders, or perfumes. You may wear deodorant.  Do not shave 48 hours prior to surgery.  Do not bring valuables to the hospital.  Contacts, dentures or bridgework may not be worn into surgery.  Leave suitcase in the car. After surgery it may be brought to your room.  For patients admitted to the hospital, checkout time is 11:00 AM the day of discharge.   Patients discharged the day of surgery will not be allowed to drive home.  Name and phone number of your driver: mr. qazi [phone]  Special Instructions: CHG Shower Use Special Wash: 1/2 bottle night before surgery and 1/2 bottle morning of surgery.   Please read over the following fact sheets that you were given: Pain Booklet, Coughing and Deep Breathing, Open Heart Packet, MRSA Information and Surgical Site Infection Prevention

## 2011-01-09 DIAGNOSIS — Z9889 Other specified postprocedural states: Secondary | ICD-10-CM

## 2011-01-09 HISTORY — DX: Other specified postprocedural states: Z98.890

## 2011-01-09 NOTE — Consult Note (Signed)
Anesthesia: 70 yr old female for L4-5 laminectomy 01/15/11.  Reported hx of her heart stopping during colonoscopy in 2008.  She denied hx of CPR, prior cardiac evaluation, or prior EKG.  Had echo in 2005 showing normal LV function, trace AI/TR.  She was sent home after the colonoscopy.  She was unclear of additional details.  I received records today from [facility].  Colonoscopy report mentions patient needing Atropine, but there are no other details.  She denies hx of CP/SOB.  CBG's average in the 140's.  She can climb stairs and vacuum without CV symptoms.  EKG noted.  Cardiac history reviewed with Dr. Gypsy Balsam.  Labs/CXR reviewed.  Plan to proceed if no new CV symptoms.

## 2011-01-14 MED ORDER — CEFAZOLIN SODIUM 1-5 GM-% IV SOLN
1.0000 g | INTRAVENOUS | Status: DC
  Filled 2011-01-14: qty 50

## 2011-01-15 ENCOUNTER — Encounter (HOSPITAL_COMMUNITY): Payer: Self-pay | Admitting: Vascular Surgery

## 2011-01-15 ENCOUNTER — Ambulatory Visit (HOSPITAL_COMMUNITY)
Admission: RE | Admit: 2011-01-15 | Discharge: 2011-01-16 | DRG: 491 | Disposition: A | Discharge status: Home / Self Care | Payer: Medicare Other | Source: Ambulatory Visit | Attending: Neurosurgery | Admitting: Neurosurgery

## 2011-01-15 ENCOUNTER — Encounter (HOSPITAL_COMMUNITY): Payer: Self-pay | Admitting: *Deleted

## 2011-01-15 ENCOUNTER — Ambulatory Visit (HOSPITAL_COMMUNITY): Payer: Medicare Other

## 2011-01-15 ENCOUNTER — Ambulatory Visit (HOSPITAL_COMMUNITY): Payer: Medicare Other | Admitting: Vascular Surgery

## 2011-01-15 ENCOUNTER — Encounter (HOSPITAL_COMMUNITY)
Admission: RE | Disposition: A | Discharge status: Home / Self Care | Payer: Self-pay | Source: Ambulatory Visit | Attending: Neurosurgery

## 2011-01-15 ENCOUNTER — Encounter (HOSPITAL_COMMUNITY): Payer: Self-pay | Admitting: Anesthesiology

## 2011-01-15 DIAGNOSIS — F17200 Nicotine dependence, unspecified, uncomplicated: Secondary | ICD-10-CM | POA: Insufficient documentation

## 2011-01-15 DIAGNOSIS — I739 Peripheral vascular disease, unspecified: Secondary | ICD-10-CM | POA: Insufficient documentation

## 2011-01-15 DIAGNOSIS — I10 Essential (primary) hypertension: Secondary | ICD-10-CM | POA: Insufficient documentation

## 2011-01-15 DIAGNOSIS — E785 Hyperlipidemia, unspecified: Secondary | ICD-10-CM | POA: Insufficient documentation

## 2011-01-15 DIAGNOSIS — I2510 Atherosclerotic heart disease of native coronary artery without angina pectoris: Secondary | ICD-10-CM | POA: Insufficient documentation

## 2011-01-15 DIAGNOSIS — J449 Chronic obstructive pulmonary disease, unspecified: Secondary | ICD-10-CM | POA: Insufficient documentation

## 2011-01-15 DIAGNOSIS — M7130 Other bursal cyst, unspecified site: Secondary | ICD-10-CM | POA: Insufficient documentation

## 2011-01-15 DIAGNOSIS — J4489 Other specified chronic obstructive pulmonary disease: Secondary | ICD-10-CM | POA: Insufficient documentation

## 2011-01-15 DIAGNOSIS — E119 Type 2 diabetes mellitus without complications: Secondary | ICD-10-CM

## 2011-01-15 HISTORY — PX: LUMBAR LAMINECTOMY/DECOMPRESSION MICRODISCECTOMY: SHX5026

## 2011-01-15 HISTORY — PX: BACK SURGERY: SHX140

## 2011-01-15 LAB — GLUCOSE, CAPILLARY: Glucose-Capillary: 280 mg/dL — ABNORMAL HIGH (ref 70–99)

## 2011-01-15 SURGERY — LUMBAR LAMINECTOMY/DECOMPRESSION MICRODISCECTOMY
Anesthesia: General | Laterality: Right

## 2011-01-15 MED ORDER — ONDANSETRON HCL 4 MG/2ML IJ SOLN
INTRAMUSCULAR | Status: DC | PRN
  Administered 2011-01-15: 4 mg via INTRAVENOUS

## 2011-01-15 MED ORDER — GLYBURIDE 1.25 MG PO TABS
1.2500 mg | ORAL_TABLET | Freq: Every day | ORAL | Status: DC
  Filled 2011-01-15 (×2): qty 1

## 2011-01-15 MED ORDER — LACTATED RINGERS IV SOLN: INTRAVENOUS | Status: DC

## 2011-01-15 MED ORDER — SODIUM CHLORIDE 0.9 % IJ SOLN: 3.0000 mL | INTRAMUSCULAR | Status: DC | PRN

## 2011-01-15 MED ORDER — BUPIVACAINE-EPINEPHRINE PF 0.5-1:200000 % IJ SOLN
INTRAMUSCULAR | Status: DC | PRN
  Administered 2011-01-15: 20 mL
  Administered 2011-01-15: 10 mL

## 2011-01-15 MED ORDER — PHENOL 1.4 % MT LIQD: 1.0000 | OROMUCOSAL | Status: DC | PRN

## 2011-01-15 MED ORDER — FENTANYL CITRATE 0.05 MG/ML IJ SOLN: 50.0000 ug | INTRAMUSCULAR | Status: DC | PRN

## 2011-01-15 MED ORDER — ONDANSETRON HCL 4 MG/2ML IJ SOLN: 4.0000 mg | INTRAMUSCULAR | Status: DC | PRN

## 2011-01-15 MED ORDER — GLYBURIDE-METFORMIN 1.25-250 MG PO TABS
1.0000 | ORAL_TABLET | Freq: Every day | ORAL | Status: DC
  Filled 2011-01-15 (×2): qty 1

## 2011-01-15 MED ORDER — CEFAZOLIN SODIUM 1-5 GM-% IV SOLN
1.0000 g | Freq: Three times a day (TID) | INTRAVENOUS | Status: AC
  Administered 2011-01-15 – 2011-01-16 (×2): 1 g via INTRAVENOUS
  Filled 2011-01-15 (×2): qty 50

## 2011-01-15 MED ORDER — SODIUM CHLORIDE 0.9 % IV SOLN: 250.0000 mL | INTRAVENOUS | Status: DC

## 2011-01-15 MED ORDER — ROSUVASTATIN CALCIUM 5 MG PO TABS
5.0000 mg | ORAL_TABLET | Freq: Every day | ORAL | Status: DC
  Filled 2011-01-15 (×2): qty 1

## 2011-01-15 MED ORDER — THROMBIN 5000 UNITS EX KIT
PACK | CUTANEOUS | Status: DC | PRN
  Administered 2011-01-15: 5000 [IU] via TOPICAL

## 2011-01-15 MED ORDER — ACETAMINOPHEN 325 MG PO TABS: 650.0000 mg | ORAL_TABLET | ORAL | Status: DC | PRN

## 2011-01-15 MED ORDER — INSULIN ASPART 100 UNIT/ML ~~LOC~~ SOLN
0.0000 [IU] | Freq: Three times a day (TID) | SUBCUTANEOUS | Status: DC
  Administered 2011-01-16: 2 [IU] via SUBCUTANEOUS
  Filled 2011-01-15: qty 3

## 2011-01-15 MED ORDER — LISINOPRIL 40 MG PO TABS
40.0000 mg | ORAL_TABLET | Freq: Every day | ORAL | Status: DC
  Filled 2011-01-15: qty 1

## 2011-01-15 MED ORDER — LACTATED RINGERS IV SOLN
INTRAVENOUS | Status: DC | PRN
  Administered 2011-01-15 (×2): via INTRAVENOUS

## 2011-01-15 MED ORDER — ZOLPIDEM TARTRATE 5 MG PO TABS: 5.0000 mg | ORAL_TABLET | Freq: Every evening | ORAL | Status: DC | PRN

## 2011-01-15 MED ORDER — DIAZEPAM 5 MG PO TABS: 5.0000 mg | ORAL_TABLET | Freq: Four times a day (QID) | ORAL | Status: DC | PRN

## 2011-01-15 MED ORDER — HYDROCHLOROTHIAZIDE 25 MG PO TABS
25.0000 mg | ORAL_TABLET | Freq: Every day | ORAL | Status: DC
  Filled 2011-01-15: qty 1

## 2011-01-15 MED ORDER — MORPHINE SULFATE 4 MG/ML IJ SOLN: 1.0000 mg | INTRAMUSCULAR | Status: DC | PRN

## 2011-01-15 MED ORDER — SODIUM CHLORIDE 0.9 % IJ SOLN: 3.0000 mL | Freq: Two times a day (BID) | INTRAMUSCULAR | Status: DC

## 2011-01-15 MED ORDER — EPHEDRINE SULFATE 50 MG/ML IJ SOLN
INTRAMUSCULAR | Status: DC | PRN
  Administered 2011-01-15: 5 mg via INTRAVENOUS
  Administered 2011-01-15 (×3): 10 mg via INTRAVENOUS
  Administered 2011-01-15: 5 mg via INTRAVENOUS

## 2011-01-15 MED ORDER — DOCUSATE SODIUM 100 MG PO CAPS
100.0000 mg | ORAL_CAPSULE | Freq: Two times a day (BID) | ORAL | Status: DC
  Administered 2011-01-15: 100 mg via ORAL
  Filled 2011-01-15: qty 1

## 2011-01-15 MED ORDER — NEOSTIGMINE METHYLSULFATE 1 MG/ML IJ SOLN
INTRAMUSCULAR | Status: DC | PRN
  Administered 2011-01-15: 2 mg via INTRAVENOUS

## 2011-01-15 MED ORDER — BACITRACIN 50000 UNITS IM SOLR
INTRAMUSCULAR | Status: DC | PRN
  Administered 2011-01-15 (×2)

## 2011-01-15 MED ORDER — INSULIN ASPART 100 UNIT/ML ~~LOC~~ SOLN
0.0000 [IU] | SUBCUTANEOUS | Status: DC
  Administered 2011-01-15: 2 [IU] via SUBCUTANEOUS
  Filled 2011-01-15: qty 3

## 2011-01-15 MED ORDER — INSULIN ASPART 100 UNIT/ML ~~LOC~~ SOLN
0.0000 [IU] | Freq: Every day | SUBCUTANEOUS | Status: DC
  Administered 2011-01-15: 3 [IU] via SUBCUTANEOUS

## 2011-01-15 MED ORDER — MENTHOL 3 MG MT LOZG: 1.0000 | LOZENGE | OROMUCOSAL | Status: DC | PRN

## 2011-01-15 MED ORDER — FENTANYL CITRATE 0.05 MG/ML IJ SOLN
INTRAMUSCULAR | Status: DC | PRN
  Administered 2011-01-15: 150 ug via INTRAVENOUS
  Administered 2011-01-15: 50 ug via INTRAVENOUS

## 2011-01-15 MED ORDER — MIDAZOLAM HCL 2 MG/2ML IJ SOLN: 1.0000 mg | INTRAMUSCULAR | Status: DC | PRN

## 2011-01-15 MED ORDER — METFORMIN HCL 500 MG PO TABS
250.0000 mg | ORAL_TABLET | Freq: Every day | ORAL | Status: DC
  Filled 2011-01-15 (×2): qty 1

## 2011-01-15 MED ORDER — GLYCOPYRROLATE 0.2 MG/ML IJ SOLN
INTRAMUSCULAR | Status: DC | PRN
  Administered 2011-01-15 (×2): 0.2 mg via INTRAVENOUS

## 2011-01-15 MED ORDER — ROCURONIUM BROMIDE 100 MG/10ML IV SOLN
INTRAVENOUS | Status: DC | PRN
  Administered 2011-01-15: 40 mg via INTRAVENOUS

## 2011-01-15 MED ORDER — OXYCODONE-ACETAMINOPHEN 5-325 MG PO TABS
1.0000 | ORAL_TABLET | ORAL | Status: DC | PRN
  Administered 2011-01-15 – 2011-01-16 (×2): 1 via ORAL
  Filled 2011-01-15 (×2): qty 1

## 2011-01-15 MED ORDER — PROPOFOL 10 MG/ML IV EMUL
INTRAVENOUS | Status: DC | PRN
  Administered 2011-01-15: 120 mg via INTRAVENOUS
  Administered 2011-01-15: 50 mg via INTRAVENOUS

## 2011-01-15 MED ORDER — LISINOPRIL-HYDROCHLOROTHIAZIDE 20-12.5 MG PO TABS: 2.0000 | ORAL_TABLET | Freq: Every day | ORAL | Status: DC

## 2011-01-15 MED ORDER — GABAPENTIN 300 MG PO CAPS
300.0000 mg | ORAL_CAPSULE | Freq: Two times a day (BID) | ORAL | Status: DC
  Filled 2011-01-15 (×5): qty 1

## 2011-01-15 MED ORDER — BACITRACIN ZINC 500 UNIT/GM EX OINT
TOPICAL_OINTMENT | CUTANEOUS | Status: DC | PRN
  Administered 2011-01-15 (×2): 1 via TOPICAL

## 2011-01-15 MED ORDER — HYDROMORPHONE HCL PF 1 MG/ML IJ SOLN: 0.2500 mg | INTRAMUSCULAR | Status: DC | PRN

## 2011-01-15 MED ORDER — MORPHINE SULFATE 2 MG/ML IJ SOLN: 0.0500 mg/kg | INTRAMUSCULAR | Status: DC | PRN

## 2011-01-15 MED ORDER — ACETAMINOPHEN 650 MG RE SUPP: 650.0000 mg | RECTAL | Status: DC | PRN

## 2011-01-15 MED ORDER — ALBUTEROL SULFATE HFA 108 (90 BASE) MCG/ACT IN AERS
INHALATION_SPRAY | RESPIRATORY_TRACT | Status: DC | PRN
  Administered 2011-01-15 (×2): 3 via RESPIRATORY_TRACT

## 2011-01-15 MED ORDER — MIDAZOLAM HCL 5 MG/5ML IJ SOLN
INTRAMUSCULAR | Status: DC | PRN
  Administered 2011-01-15: 2 mg via INTRAVENOUS

## 2011-01-15 MED ORDER — CEFAZOLIN SODIUM 1-5 GM-% IV SOLN
INTRAVENOUS | Status: DC | PRN
  Administered 2011-01-15: 1 g via INTRAVENOUS

## 2011-01-15 SURGICAL SUPPLY — 57 items
BAG DECANTER FOR FLEXI CONT (MISCELLANEOUS) ×2 IMPLANT
BENZOIN TINCTURE PRP APPL 2/3 (GAUZE/BANDAGES/DRESSINGS) ×2 IMPLANT
BLADE SURG ROTATE 9660 (MISCELLANEOUS) IMPLANT
BRUSH SCRUB EZ PLAIN DRY (MISCELLANEOUS) ×2 IMPLANT
BUR ACORN 6.0 (BURR) ×2 IMPLANT
BUR MATCHSTICK NEURO 3.0 LAGG (BURR) ×2 IMPLANT
CANISTER SUCTION 2500CC (MISCELLANEOUS) ×2 IMPLANT
CLOTH BEACON ORANGE TIMEOUT ST (SAFETY) ×2 IMPLANT
CONT SPEC 4OZ CLIKSEAL STRL BL (MISCELLANEOUS) ×2 IMPLANT
DRAPE LAPAROTOMY 100X72X124 (DRAPES) ×2 IMPLANT
DRAPE MICROSCOPE LEICA (MISCELLANEOUS) ×2 IMPLANT
DRAPE POUCH INSTRU U-SHP 10X18 (DRAPES) ×2 IMPLANT
DRAPE SURG 17X23 STRL (DRAPES) ×8 IMPLANT
ELECT BLADE 4.0 EZ CLEAN MEGAD (MISCELLANEOUS) ×2
ELECT REM PT RETURN 9FT ADLT (ELECTROSURGICAL) ×2
ELECTRODE BLDE 4.0 EZ CLN MEGD (MISCELLANEOUS) ×1 IMPLANT
ELECTRODE REM PT RTRN 9FT ADLT (ELECTROSURGICAL) ×1 IMPLANT
GAUZE SPONGE 4X4 16PLY XRAY LF (GAUZE/BANDAGES/DRESSINGS) IMPLANT
GLOVE BIO SURGEON STRL SZ8.5 (GLOVE) ×2 IMPLANT
GLOVE BIOGEL PI IND STRL 7.5 (GLOVE) ×1 IMPLANT
GLOVE BIOGEL PI IND STRL 8 (GLOVE) ×1 IMPLANT
GLOVE BIOGEL PI IND STRL 8.5 (GLOVE) ×1 IMPLANT
GLOVE BIOGEL PI INDICATOR 7.5 (GLOVE) ×1
GLOVE BIOGEL PI INDICATOR 8 (GLOVE) ×1
GLOVE BIOGEL PI INDICATOR 8.5 (GLOVE) ×1
GLOVE ECLIPSE 7.5 STRL STRAW (GLOVE) ×4 IMPLANT
GLOVE EXAM NITRILE LRG STRL (GLOVE) IMPLANT
GLOVE EXAM NITRILE MD LF STRL (GLOVE) IMPLANT
GLOVE EXAM NITRILE XL STR (GLOVE) IMPLANT
GLOVE EXAM NITRILE XS STR PU (GLOVE) IMPLANT
GLOVE SS BIOGEL STRL SZ 8 (GLOVE) ×1 IMPLANT
GLOVE SUPERSENSE BIOGEL SZ 8 (GLOVE) ×1
GLOVE SURG SS PI 8.0 STRL IVOR (GLOVE) ×2 IMPLANT
GOWN BRE IMP SLV AUR LG STRL (GOWN DISPOSABLE) ×2 IMPLANT
GOWN BRE IMP SLV AUR XL STRL (GOWN DISPOSABLE) ×4 IMPLANT
GOWN STRL REIN 2XL LVL4 (GOWN DISPOSABLE) ×2 IMPLANT
KIT BASIN OR (CUSTOM PROCEDURE TRAY) ×2 IMPLANT
KIT ROOM TURNOVER OR (KITS) ×2 IMPLANT
NEEDLE HYPO 21X1.5 SAFETY (NEEDLE) IMPLANT
NEEDLE HYPO 22GX1.5 SAFETY (NEEDLE) ×2 IMPLANT
NS IRRIG 1000ML POUR BTL (IV SOLUTION) ×2 IMPLANT
PACK LAMINECTOMY NEURO (CUSTOM PROCEDURE TRAY) ×2 IMPLANT
PAD ARMBOARD 7.5X6 YLW CONV (MISCELLANEOUS) ×10 IMPLANT
PATTIES SURGICAL .5 X1 (DISPOSABLE) IMPLANT
RUBBERBAND STERILE (MISCELLANEOUS) ×6 IMPLANT
SPONGE GAUZE 4X4 12PLY (GAUZE/BANDAGES/DRESSINGS) ×2 IMPLANT
SPONGE SURGIFOAM ABS GEL SZ50 (HEMOSTASIS) ×2 IMPLANT
STRIP CLOSURE SKIN 1/2X4 (GAUZE/BANDAGES/DRESSINGS) ×2 IMPLANT
SUT VIC AB 1 CT1 18XBRD ANBCTR (SUTURE) ×1 IMPLANT
SUT VIC AB 1 CT1 8-18 (SUTURE) ×1
SUT VIC AB 2-0 CP2 18 (SUTURE) ×2 IMPLANT
SYR 20CC LL (SYRINGE) IMPLANT
SYR 20ML ECCENTRIC (SYRINGE) ×2 IMPLANT
TAPE CLOTH SURG 4X10 WHT LF (GAUZE/BANDAGES/DRESSINGS) ×2 IMPLANT
TOWEL OR 17X24 6PK STRL BLUE (TOWEL DISPOSABLE) ×2 IMPLANT
TOWEL OR 17X26 10 PK STRL BLUE (TOWEL DISPOSABLE) ×2 IMPLANT
WATER STERILE IRR 1000ML POUR (IV SOLUTION) ×2 IMPLANT

## 2011-01-15 NOTE — Anesthesia Preprocedure Evaluation (Addendum)
Anesthesia Evaluation  Patient identified by MRN, date of birth, ID band Patient awake    Reviewed: Allergy & Precautions, H&P , NPO status , Patient's Chart, lab work & pertinent test results  History of Anesthesia Complications (+) PONV  Airway Mallampati: II TM Distance: >3 FB Neck ROM: full    Dental  (+) Partial Upper, Teeth Intact and Dental Advidsory Given   Pulmonary shortness of breath and with exertion, COPDCurrent Smoker,  clear to auscultation        Cardiovascular hypertension, On Medications + CAD and + Peripheral Vascular Disease regular     Neuro/Psych Pain and numbness Rt > Lt lower extremity    GI/Hepatic   Endo/Other  Diabetes mellitus-, Well Controlled, Type 2  Renal/GU      Musculoskeletal   Abdominal   Peds  Hematology   Anesthesia Other Findings   Reproductive/Obstetrics                         Anesthesia Physical Anesthesia Plan  ASA: III  Anesthesia Plan: General   Post-op Pain Management:    Induction: Intravenous  Airway Management Planned: Oral ETT  Additional Equipment:   Intra-op Plan:   Post-operative Plan:   Informed Consent: I have reviewed the patients History and Physical, chart, labs and discussed the procedure including the risks, benefits and alternatives for the proposed anesthesia with the patient or authorized representative who has indicated his/her understanding and acceptance.   Dental advisory given  Plan Discussed with: CRNA  Anesthesia Plan Comments:         Anesthesia Quick Evaluation

## 2011-01-15 NOTE — Preoperative (Signed)
Beta Blockers   Reason not to administer Beta Blockers:Not Applicable 

## 2011-01-15 NOTE — Transfer of Care (Signed)
Immediate Anesthesia Transfer of Care Note  Patient: Jordan [name]  Procedure(s) Performed:  LUMBAR LAMINECTOMY/DECOMPRESSION MICRODISCECTOMY - Right L5-4 laminectomy  Patient Location: PACU  Anesthesia Type: General  Level of Consciousness: oriented, sedated and patient cooperative  Airway & Oxygen Therapy: Patient Spontanous Breathing and Patient connected to face mask oxygen  Post-op Assessment: Report given to PACU RN and Post -op Vital signs reviewed and stable  Post vital signs: Reviewed and stable  Complications: No apparent anesthesia complications
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
## 2011-01-15 NOTE — Progress Notes (Signed)
Subjective:  The patient looks and feels well. She has no complaints.  Objective: Vital signs in last 24 hours: Temp:  [97.4 F (36.3 C)-98.1 F (36.7 C)] 97.4 F (36.3 C) (11/07 1635) Pulse Rate:  [64-76] 64  (11/07 1635) Resp:  [17-22] 20  (11/07 1400) BP: (104-159)/(45-69) 104/69 mmHg (11/07 1635) SpO2:  [95 %-100 %] 95 % (11/07 1635) Weight:  [61.145 kg (134 lb 12.8 oz)] 134 lb 12.8 oz (61.145 kg) (11/07 1418)  Intake/Output from previous day:   Intake/Output this shift: Total I/O In: 1850 [I.V.:1850] Out: 300 [Urine:250; Blood:50]  Physical exam the patient is alert and oriented. She is moving all her extremities well.  Lab Results: No results found for this basename: WBC:2,HGB:2,HCT:2,PLT:2 in the last 72 hours BMET No results found for this basename: NA:2,K:2,CL:2,CO2:2,GLUCOSE:2,BUN:2,CREATININE:2,CALCIUM:2 in the last 72 hours  Studies/Results: Dg Lumbar Spine 1 View Neuro Rm 31 ,portable #12  01/15/2011  *RADIOLOGY REPORT*  Clinical Data: Lumbar laminectomy, decompression.  LUMBAR SPINE - 1 VIEW  Comparison: 12/06/2010  Findings: Single lateral intraoperative image demonstrates posterior surgical instruments at the L5-S1 level.  IMPRESSION: Intraoperative localization as above.  Original Report Authenticated By: Cyndie Chime, M.D.    Assessment/Plan: The patient is doing well. Will tentatively plan discharge for tomorrow  LOS: 0 days     Tereasa Yilmaz D 01/15/2011, 6:31 PM

## 2011-01-15 NOTE — Op Note (Signed)
Brief history: The patient is a 70 year old white female who has suffered from back right buttocks and right leg pain. She has failed medical management. She was worked up with a lumbar MRI which demonstrated she had a synovial cyst on the right at L4-5. I discussed the various treatment options with her including surgery. The patient has weighed the risks benefits and alternatives to surgery and decided proceed with a right L4-5 laminotomy foraminotomy.  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  Postop diagnosis: Same.  Procedure: Right L4-5 laminotomy and foraminotomy for removal of synovial cyst using microdissection.  Surgeon: Dr. Delma Officer  Asst.: Dr. Shirlean Kelly  Estimated blood loss: 50 cc  Consultations none  Drains none  Description of procedure: The patient was brought to the operating room by the anesthesia team. General endotracheal anesthesia was induced. The patient was turned to the prone position on the Wilson frame. Her lumbosacral region was then prepared with Betadine scrub and Betadine solution. Sterile drapes were applied. I then injected it in size with Marcaine with epinephrine solution.   I then used a scalpel to make a linear incision over the L4-5 interspace. I used electrocautery to form a right-sided subperiosteal dissection exposing the right spinous process and lamina of L4 and L5. We confirmed the location with intraoperative radiograph. I then inserted the [facility] retractor. I then used a high-speed drill to perform a right L4 laminotomy. I widen the laminotomy with Kerrison punch and removed the L4-5 ligamentum flavum. I also used a Kerrison punch to remove the cephalad aspect of the L5 lamina. I performed a foraminotomy about the right L5 nerve root. I then brought the operative microscope into the field. Under its magnification and illumination I completed the microseconds/decompression. I freed  up the thecal sac and the L5 nerve root from epidural tissue. We encountered a synovial cyst as expected. We removed the cyst using Kerrison punches. I then inspected the L4-5 intervertebral disc. There was no significant herniations. I then palpated along the ventral surface of the thecal sac and along the exit wound of the L5 nerve root. I noted the neural structures were well decompressed. We then obtained hemostasis using bipolar cautery. We then irrigated the wound out with bacitracin solution. I removed the retractor. We reapproximated patient's thoracic lumbar fascia with interrupted #1 Vicryl suture. We reapproximated the subcutaneous tissue with interrupted 2-0 Vicryl suture. We reapproximated the skin with Steri-Strips and benzoin. The wound was then coated with bacitracin ointment. Sterile dressing was applied.  Patient was then returned to the supine position where she was extubated by the anesthesia team. She was then transported to the post anesthesia care unit in stable condition. All sponge instrument and needle counts were correct at the end this case.

## 2011-01-15 NOTE — Anesthesia Postprocedure Evaluation (Signed)
  Anesthesia Post-op Note  Patient: Jordan [name]  Procedure(s) Performed:  LUMBAR LAMINECTOMY/DECOMPRESSION MICRODISCECTOMY - Right L5-4 laminectomy  Patient Location: PACU  Anesthesia Type: General  Level of Consciousness: awake  Airway and Oxygen Therapy: Patient Spontanous Breathing  Post-op Pain: mild  Post-op Assessment: Post-op Vital signs reviewed  Post-op Vital Signs: stable  Complications: No apparent anesthesia complications

## 2011-01-15 NOTE — Anesthesia Procedure Notes (Signed)
Procedure Name: Intubation Date/Time: 01/15/2011 11:51 AM Performed by: Leona Singleton A. Oxygen Delivery Method: Circle System Utilized Preoxygenation: Pre-oxygenation with 100% oxygen Intubation Type: IV induction Ventilation: Mask ventilation without difficulty Laryngoscope Size: Miller and 2 Grade View: Grade I Tube type: Oral Tube size: 7.0 mm Number of attempts: 1 Airway Equipment and Method: stylet Placement Confirmation: ETT inserted through vocal cords under direct vision,  positive ETCO2 and breath sounds checked- equal and bilateral Secured at: 21 cm Tube secured with: Tape Dental Injury: Teeth and Oropharynx as per pre-operative assessment  Comments: 4ml LTA 4% Lido used

## 2011-01-15 NOTE — Progress Notes (Signed)
Subjective:  The patient is in no acute distress. She looks well. She is somnolent but easily arousable.  Objective: Vital signs in last 24 hours: Temp:  [98 F (36.7 C)-98.1 F (36.7 C)] 98 F (36.7 C) (11/07 1330) Pulse Rate:  [70] 70  (11/07 0843) Resp:  [17-20] 17  (11/07 1330) BP: (132-142)/(57-67) 142/57 mmHg (11/07 1330) SpO2:  [100 %] 100 % (11/07 1330)  Intake/Output from previous day:   Intake/Output this shift: Total I/O In: 1750 [I.V.:1750] Out: 50 [Blood:50]  Physical exam patient is Glasgow Coma Scale 13. She is somnolent but easily arousable. She is moving all extremities well.   Lab Results: No results found for this basename: WBC:2,HGB:2,HCT:2,PLT:2 in the last 72 hours BMET No results found for this basename: NA:2,K:2,CL:2,CO2:2,GLUCOSE:2,BUN:2,CREATININE:2,CALCIUM:2 in the last 72 hours  Studies/Results: No results found.  Assessment/Plan: The patient is doing well status post her surgery.  LOS: 0 days     Emmogene Simson D 01/15/2011, 1:54 PM

## 2011-01-16 LAB — GLUCOSE, CAPILLARY: Glucose-Capillary: 128 mg/dL — ABNORMAL HIGH (ref 70–99)

## 2011-01-16 MED ORDER — DSS 100 MG PO CAPS: 100.0000 mg | ORAL_CAPSULE | Freq: Two times a day (BID) | ORAL | Status: AC

## 2011-01-16 MED ORDER — OXYCODONE-ACETAMINOPHEN 10-325 MG PO TABS: 1.0000 | ORAL_TABLET | ORAL | Status: AC | PRN

## 2011-01-16 MED ORDER — DIAZEPAM 5 MG PO TABS: 5.0000 mg | ORAL_TABLET | Freq: Four times a day (QID) | ORAL | Status: AC | PRN

## 2011-01-16 NOTE — Discharge Summary (Signed)
  Physician Discharge Summary  Patient ID: Jordan [name] MRN: [id number] DOB/AGE: 1940-08-19 70 y.o.  Admit date: 01/15/2011 Discharge date: 01/16/2011  Admission Diagnoses:  Discharge Diagnoses:  L4-5 synovial cyst. Lumbar radiculopathy. Lumbar spinal stenosis. Discharged Condition: good  Hospital Course: The patient was admitted to [facility] on 01/15/2011. I performed a right L4-5 laminotomy and foraminotomy with removal of synovial cyst. The surgery were well. The patient's postoperative course was unremarkable.  Consults: None Significant Diagnostic Studies: None Treatments: Lumbar laminectomy Discharge Exam: Blood pressure 139/48, pulse 91, temperature 98.8 F (37.1 C), temperature source Oral, resp. rate 16, weight 61.145 kg (134 lb 12.8 oz), SpO2 95.00%. The patient is alert and oriented she is moving all her extremities well. With normal lower extremity motor strength. Her wound is healing well without discharge . Disposition: The patient is discharged to home.  Discharge Orders    Future Appointments: Provider: Department: Dept Phone: Center:   01/22/2011 9:00 AM [facility] [phone] [facility]   01/29/2011 9:45 AM Simmie Davies Panosh [facility] [phone] [facility]   02/05/2011 11:00 AM [facility] [phone] [facility]   02/05/2011 12:30 PM Chuck Hint, MD [facility] [phone] [facility]     Future Orders Please Complete By Expires   Diet - low sodium heart healthy      Increase activity slowly      Discharge instructions      Comments:   Call for a followup appointment.     Current Discharge Medication List    START taking these medications   Details  diazepam (VALIUM) 5 MG tablet Take 1 tablet (5 mg total) by mouth every 6 (six) hours as needed. Qty: 50 tablet, Refills: 1    docusate sodium 100 MG CAPS Take 100 mg by mouth 2 (two) times daily. Qty: 60 capsule, Refills: 1      oxyCODONE-acetaminophen (PERCOCET) 10-325 MG per tablet Take 1 tablet by mouth every 4 (four) hours as needed for pain. Qty: 100 tablet, Refills: 0      CONTINUE these medications which have NOT CHANGED   Details  aspirin 81 MG tablet Take 81 mg by mouth daily.     calcium carbonate (OS-CAL - DOSED IN MG OF ELEMENTAL CALCIUM) 1250 MG tablet Take 1 tablet by mouth daily.      Cholecalciferol (VITAMIN D3) 2000 UNITS TABS Take 2,000 Units by mouth daily.      fish oil-omega-3 fatty acids 1000 MG capsule Take 2 g by mouth daily.     gabapentin (NEURONTIN) 300 MG capsule Take 300 mg by mouth 2 (two) times daily.      glucose blood (ACCU-CHEK COMPACT TEST DRUM) test strip 1 each by Other route as needed. Use as instructed     glyBURIDE-metformin (GLUCOVANCE) 1.25-250 MG per tablet TAKE 1 TABLET BY MOUTH TWICE DAILY Qty: 180 tablet, Refills: 0    lisinopril-hydrochlorothiazide (PRINZIDE,ZESTORETIC) 20-12.5 MG per tablet Take 2 tablets by mouth daily.      LYSINE PO Take 1 tablet by mouth daily.      MULTIPLE VITAMIN PO Take by mouth.      rosuvastatin (CRESTOR) 5 MG tablet Take 5 mg by mouth daily.           SignedTressie Stalker D 01/16/2011, 8:06 AM

## 2011-01-22 ENCOUNTER — Encounter (HOSPITAL_COMMUNITY): Payer: Self-pay | Admitting: Neurosurgery

## 2011-01-22 ENCOUNTER — Other Ambulatory Visit (INDEPENDENT_AMBULATORY_CARE_PROVIDER_SITE_OTHER): Payer: Medicare Other

## 2011-01-22 DIAGNOSIS — Z0000 Encounter for general adult medical examination without abnormal findings: Secondary | ICD-10-CM

## 2011-01-22 DIAGNOSIS — I10 Essential (primary) hypertension: Secondary | ICD-10-CM

## 2011-01-22 DIAGNOSIS — E785 Hyperlipidemia, unspecified: Secondary | ICD-10-CM

## 2011-01-22 DIAGNOSIS — Z79899 Other long term (current) drug therapy: Secondary | ICD-10-CM

## 2011-01-22 DIAGNOSIS — E119 Type 2 diabetes mellitus without complications: Secondary | ICD-10-CM

## 2011-01-22 LAB — LIPID PANEL
Cholesterol: 139 mg/dL (ref 0–200)
HDL: 44.6 mg/dL (ref >39.00)
LDL Cholesterol: 54 mg/dL (ref 0–99)
Total CHOL/HDL Ratio: 3
Triglycerides: 200 mg/dL — ABNORMAL HIGH (ref 0.0–149.0)
VLDL: 40 mg/dL (ref 0.0–40.0)

## 2011-01-22 LAB — HEPATIC FUNCTION PANEL
Alkaline Phosphatase: 70 U/L (ref 39–117)
Bilirubin, Direct: 0 mg/dL (ref 0.0–0.3)
Total Bilirubin: 0.5 mg/dL (ref 0.3–1.2)

## 2011-01-22 LAB — BASIC METABOLIC PANEL
BUN: 24 mg/dL — ABNORMAL HIGH (ref 6–23)
CO2: 32 mEq/L (ref 19–32)
Calcium: 10.8 mg/dL — ABNORMAL HIGH (ref 8.4–10.5)
Chloride: 100 mEq/L (ref 96–112)
Creatinine, Ser: 0.7 mg/dL (ref 0.4–1.2)

## 2011-01-22 LAB — POCT URINALYSIS DIPSTICK
Blood, UA: NEGATIVE
Protein, UA: NEGATIVE
Spec Grav, UA: 1.015
Urobilinogen, UA: 0.2
pH, UA: 5

## 2011-01-22 LAB — CBC WITH DIFFERENTIAL/PLATELET
Eosinophils Absolute: 0.3 10*3/uL (ref 0.0–0.7)
Lymphocytes Relative: 30.8 % (ref 12.0–46.0)
MCHC: 34.1 g/dL (ref 30.0–36.0)
MCV: 92.4 fl (ref 78.0–100.0)
Monocytes Absolute: 0.5 10*3/uL (ref 0.1–1.0)
Neutrophils Relative %: 55.5 % (ref 43.0–77.0)
Platelets: 269 10*3/uL (ref 150.0–400.0)
RBC: 4.32 Mil/uL (ref 3.87–5.11)
WBC: 6 10*3/uL (ref 4.5–10.5)

## 2011-01-23 LAB — MICROALBUMIN / CREATININE URINE RATIO: Creatinine,U: 89.8 mg/dL

## 2011-01-29 ENCOUNTER — Encounter: Payer: Self-pay | Admitting: Internal Medicine

## 2011-01-29 ENCOUNTER — Ambulatory Visit (INDEPENDENT_AMBULATORY_CARE_PROVIDER_SITE_OTHER): Payer: Medicare Other | Admitting: Internal Medicine

## 2011-01-29 ENCOUNTER — Other Ambulatory Visit: Payer: Self-pay | Admitting: Internal Medicine

## 2011-01-29 VITALS — BP 130/60 | HR 72 | Ht 66.25 in | Wt 139.0 lb

## 2011-01-29 DIAGNOSIS — E785 Hyperlipidemia, unspecified: Secondary | ICD-10-CM

## 2011-01-29 DIAGNOSIS — E119 Type 2 diabetes mellitus without complications: Secondary | ICD-10-CM

## 2011-01-29 DIAGNOSIS — I739 Peripheral vascular disease, unspecified: Secondary | ICD-10-CM

## 2011-01-29 DIAGNOSIS — J449 Chronic obstructive pulmonary disease, unspecified: Secondary | ICD-10-CM

## 2011-01-29 DIAGNOSIS — J069 Acute upper respiratory infection, unspecified: Secondary | ICD-10-CM

## 2011-01-29 DIAGNOSIS — I779 Disorder of arteries and arterioles, unspecified: Secondary | ICD-10-CM

## 2011-01-29 DIAGNOSIS — Z0000 Encounter for general adult medical examination without abnormal findings: Secondary | ICD-10-CM | POA: Insufficient documentation

## 2011-01-29 DIAGNOSIS — I10 Essential (primary) hypertension: Secondary | ICD-10-CM

## 2011-01-29 DIAGNOSIS — R946 Abnormal results of thyroid function studies: Secondary | ICD-10-CM | POA: Insufficient documentation

## 2011-01-29 DIAGNOSIS — F17200 Nicotine dependence, unspecified, uncomplicated: Secondary | ICD-10-CM

## 2011-01-29 HISTORY — DX: Hypercalcemia: E83.52

## 2011-01-29 NOTE — Patient Instructions (Addendum)
We need to repeat some labs regarding the calcium and thyroid . Next week  Otherwise no change.  Will notify you  of labs when available. And then pan follow up Stop tobacco as we discussed   ROV 6 months and hg a1c pre visit
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
## 2011-02-02 DIAGNOSIS — J069 Acute upper respiratory infection, unspecified: Secondary | ICD-10-CM | POA: Insufficient documentation

## 2011-02-02 DIAGNOSIS — Z0000 Encounter for general adult medical examination without abnormal findings: Secondary | ICD-10-CM | POA: Insufficient documentation

## 2011-02-02 NOTE — Assessment & Plan Note (Signed)
Slight up recently poss from decrease activity and prednisone injections no complications .   No change for now except LSI.  Lab Results  Component Value Date   HGBA1C 7.0* 01/22/2011

## 2011-02-02 NOTE — Assessment & Plan Note (Signed)
No change  Lab Results  Component Value Date   LDLCALC 54 01/22/2011

## 2011-02-02 NOTE — Assessment & Plan Note (Signed)
  BP Readings from Last 3 Encounters:  01/29/11 130/60  01/16/11 128/41  01/16/11 128/41

## 2011-02-04 ENCOUNTER — Encounter: Payer: Self-pay | Admitting: Vascular Surgery

## 2011-02-05 ENCOUNTER — Ambulatory Visit (INDEPENDENT_AMBULATORY_CARE_PROVIDER_SITE_OTHER): Payer: Medicare Other | Admitting: Vascular Surgery

## 2011-02-05 ENCOUNTER — Other Ambulatory Visit (INDEPENDENT_AMBULATORY_CARE_PROVIDER_SITE_OTHER): Payer: Medicare Other | Admitting: *Deleted

## 2011-02-05 ENCOUNTER — Other Ambulatory Visit (INDEPENDENT_AMBULATORY_CARE_PROVIDER_SITE_OTHER): Payer: Medicare Other

## 2011-02-05 ENCOUNTER — Encounter: Payer: Self-pay | Admitting: Vascular Surgery

## 2011-02-05 VITALS — BP 153/79 | HR 89 | Resp 18 | Ht 66.0 in | Wt 138.0 lb

## 2011-02-05 DIAGNOSIS — I6529 Occlusion and stenosis of unspecified carotid artery: Secondary | ICD-10-CM

## 2011-02-05 DIAGNOSIS — E119 Type 2 diabetes mellitus without complications: Secondary | ICD-10-CM

## 2011-02-05 DIAGNOSIS — R946 Abnormal results of thyroid function studies: Secondary | ICD-10-CM

## 2011-02-05 LAB — BASIC METABOLIC PANEL
CO2: 31 mEq/L (ref 19–32)
Chloride: 103 mEq/L (ref 96–112)
GFR: 90.87 mL/min (ref >60.00)
Glucose, Bld: 126 mg/dL — ABNORMAL HIGH (ref 70–99)
Potassium: 3.9 mEq/L (ref 3.5–5.1)
Sodium: 142 mEq/L (ref 135–145)

## 2011-02-05 LAB — T4, FREE: Free T4: 0.98 ng/dL (ref 0.60–1.60)
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
## 2011-02-06 ENCOUNTER — Ambulatory Visit: Payer: Medicare Other | Admitting: Vascular Surgery

## 2011-02-06 ENCOUNTER — Other Ambulatory Visit: Payer: Medicare Other

## 2011-02-06 LAB — PTH, INTACT AND CALCIUM
Calcium, Total (PTH): 10.4 mg/dL (ref 8.4–10.5)
PTH: 51.3 pg/mL (ref 14.0–72.0)

## 2011-02-08 ENCOUNTER — Other Ambulatory Visit: Payer: Self-pay | Admitting: Internal Medicine

## 2011-02-11 ENCOUNTER — Other Ambulatory Visit: Payer: Self-pay | Admitting: Internal Medicine

## 2011-02-11 NOTE — Progress Notes (Signed)
Quick Note:  Pt aware of results. Order sent to [facility]. ______

## 2011-02-12 NOTE — Procedures (Unsigned)
CAROTID DUPLEX EXAM  INDICATION:  Follow up carotid disease.  HISTORY: Diabetes:  Yes. Cardiac:  No. Hypertension:  Yes. Smoking:  Yes. Previous Surgery:  No. CV History:  Asymptomatic. Amaurosis Fugax No, Paresthesias No, Hemiparesis No.                                      RIGHT             LEFT Brachial systolic pressure:         147               145 Brachial Doppler waveforms:         Normal            Normal Vertebral direction of flow:        Antegrade         Antegrade DUPLEX VELOCITIES (cm/sec) CCA peak systolic                   73                74 ECA peak systolic                   88                134 ICA peak systolic                   103               236 ICA end diastolic                   24                36 PLAQUE MORPHOLOGY:                  Heterogenous      Heterogenous PLAQUE AMOUNT:                      Minimal           Moderate PLAQUE LOCATION:                    CCA, ICA          CCA, ICA  IMPRESSION: 1. Right internal carotid artery velocities suggest 1% to 39%     stenosis. 2. Left internal carotid artery velocities suggest 40% to 59% stenosis     based on velocities. 3. Antegrade vertebral arteries bilaterally.  ___________________________________________ Di Kindle. Edilia Bo, M.D.  EM/MEDQ  D:  02/05/2011  T:  02/05/2011  Job:  [id number]

## 2011-02-16 ENCOUNTER — Other Ambulatory Visit: Payer: Self-pay | Admitting: Internal Medicine

## 2011-02-17 ENCOUNTER — Encounter: Payer: Self-pay | Admitting: Internal Medicine

## 2011-04-08 ENCOUNTER — Other Ambulatory Visit (HOSPITAL_COMMUNITY): Payer: Self-pay | Admitting: Endocrinology

## 2011-04-08 DIAGNOSIS — E0590 Thyrotoxicosis, unspecified without thyrotoxic crisis or storm: Secondary | ICD-10-CM

## 2011-04-22 ENCOUNTER — Encounter (HOSPITAL_COMMUNITY)
Admission: RE | Admit: 2011-04-22 | Discharge: 2011-04-22 | Disposition: A | Discharge status: Home / Self Care | Payer: Medicare Other | Source: Ambulatory Visit | Attending: Endocrinology | Admitting: Endocrinology

## 2011-04-22 DIAGNOSIS — E0590 Thyrotoxicosis, unspecified without thyrotoxic crisis or storm: Secondary | ICD-10-CM | POA: Insufficient documentation

## 2011-04-23 ENCOUNTER — Ambulatory Visit (HOSPITAL_COMMUNITY)
Admission: RE | Admit: 2011-04-23 | Discharge: 2011-04-23 | Disposition: A | Discharge status: Home / Self Care | Payer: Medicare Other | Source: Ambulatory Visit | Attending: Endocrinology | Admitting: Endocrinology

## 2011-04-23 DIAGNOSIS — E0590 Thyrotoxicosis, unspecified without thyrotoxic crisis or storm: Secondary | ICD-10-CM | POA: Insufficient documentation

## 2011-04-23 MED ORDER — SODIUM PERTECHNETATE TC 99M INJECTION
10.5000 | Freq: Once | INTRAVENOUS | Status: AC | PRN
  Administered 2011-04-23: 11 via INTRAVENOUS

## 2011-04-23 MED ORDER — SODIUM IODIDE I 131 CAPSULE
5.5000 | Freq: Once | INTRAVENOUS | Status: AC | PRN
  Administered 2011-04-22: 5.5 via ORAL

## 2011-05-14 ENCOUNTER — Telehealth: Payer: Self-pay | Admitting: *Deleted

## 2011-05-14 NOTE — Telephone Encounter (Signed)
Patient is calling because she had an office visit with Dr Talmage Nap and she is not happy with the care she received.  Any suggestions?

## 2011-05-17 ENCOUNTER — Other Ambulatory Visit: Payer: Self-pay | Admitting: Internal Medicine

## 2011-05-20 ENCOUNTER — Encounter: Payer: Self-pay | Admitting: Internal Medicine

## 2011-05-20 ENCOUNTER — Ambulatory Visit (INDEPENDENT_AMBULATORY_CARE_PROVIDER_SITE_OTHER): Payer: Medicare Other | Admitting: Internal Medicine

## 2011-05-20 VITALS — BP 138/90

## 2011-05-20 DIAGNOSIS — R4586 Emotional lability: Secondary | ICD-10-CM

## 2011-05-20 DIAGNOSIS — R634 Abnormal weight loss: Secondary | ICD-10-CM

## 2011-05-20 DIAGNOSIS — E1151 Type 2 diabetes mellitus with diabetic peripheral angiopathy without gangrene: Secondary | ICD-10-CM

## 2011-05-20 DIAGNOSIS — E0590 Thyrotoxicosis, unspecified without thyrotoxic crisis or storm: Secondary | ICD-10-CM

## 2011-05-20 DIAGNOSIS — I739 Peripheral vascular disease, unspecified: Secondary | ICD-10-CM

## 2011-05-20 DIAGNOSIS — F17200 Nicotine dependence, unspecified, uncomplicated: Secondary | ICD-10-CM

## 2011-05-20 DIAGNOSIS — I798 Other disorders of arteries, arterioles and capillaries in diseases classified elsewhere: Secondary | ICD-10-CM

## 2011-05-20 DIAGNOSIS — E1159 Type 2 diabetes mellitus with other circulatory complications: Secondary | ICD-10-CM

## 2011-05-20 NOTE — Patient Instructions (Signed)
Ok to take medication and  Arrange other referral .  For endocrine.  Plan labs in May as we discussed and add tsh free t4 and cbc diff  To the diabetes labs    Call if get rash or fever  In   the meantime.

## 2011-05-20 NOTE — Telephone Encounter (Signed)
Has ov with me today .

## 2011-05-20 NOTE — Progress Notes (Signed)
  Subjective:    Patient ID: Jordan [name], female    DOB: 1940/10/11, 71 y.o.   MRN: [id number]  HPI Patient comes in today for follow up of  multiple medical problems.  But mostly what to do about thyroid issue. Since last  Visit she has had her back surgery  And was laid up with this also  Has seen Dr Talmage Nap and  Communication from her office is sub par for her to understand  what to do next . Offered  To do RAI surgery or med tapazole info given by  Staff over the phone .   Doesn't want to go back because of this situation.   Had scan in January and never heard til called Feb 14 and then call back March 4th  Breathing ok. Moodier more and tearful at times  Has lost weight but eating and no diarrhea swelling  . No change in claudications.  Review of Systems Dm stable  Problematic knee and advised to do surgery but has to be mobile for her family obligations and caretakings.    Objective:   Physical Exam Wt Readings from Last 3 Encounters:  02/05/11 138 lb (62.596 kg)  01/29/11 139 lb (63.05 kg)  01/15/11 134 lb 12.8 oz (61.145 kg)  at home  128  Declined weight  Since .  Last visit WDWN in nad   Emotional at times  Pulse rr  No clubbing cyanosis or edema Reviewed what is available in ehr to the best i could find .     Assessment & Plan:  Hyperthyroid   Recent weight loss poss related unsure if related to degree of above  Emotional lability recent  ? If from this   Ask  about taking tapazole and other  Referral      Ok to take for now and fu  There is a risk of rash and low cbc etc  .   Should fu  And may not be a long term solutions Thyroid disease however Could be cause of above.   Hypercalcemia  Borderline  Taken off hctz  But pt went back  On will have to repeat when hydrated .

## 2011-05-25 DIAGNOSIS — R4586 Emotional lability: Secondary | ICD-10-CM | POA: Insufficient documentation

## 2011-05-25 DIAGNOSIS — R634 Abnormal weight loss: Secondary | ICD-10-CM | POA: Insufficient documentation

## 2011-05-25 DIAGNOSIS — E0590 Thyrotoxicosis, unspecified without thyrotoxic crisis or storm: Secondary | ICD-10-CM | POA: Insufficient documentation

## 2011-05-25 DIAGNOSIS — E1151 Type 2 diabetes mellitus with diabetic peripheral angiopathy without gangrene: Secondary | ICD-10-CM | POA: Insufficient documentation

## 2011-05-25 NOTE — Assessment & Plan Note (Signed)
Pt went back on meds cause they worked better   [employer] NA today

## 2011-06-02 ENCOUNTER — Telehealth: Payer: Self-pay | Admitting: Internal Medicine

## 2011-06-02 NOTE — Telephone Encounter (Signed)
Pt called and said that she is having problems with thyroid and that 3 wks ago pt said that Dr Fabian Sharp was suppose to be trying to come up with another Endocrinologist, but pt has not heard back anything re: this matter. Pt said that med makes her sick to stomach. Pls call back.

## 2011-06-03 NOTE — Telephone Encounter (Signed)
Methimole 10mg - it makes her feel nausous. Camelia Eng- is taken of the referral end.

## 2011-06-04 ENCOUNTER — Other Ambulatory Visit: Payer: Self-pay | Admitting: Internal Medicine

## 2011-06-04 NOTE — Telephone Encounter (Signed)
Please update med list  As tapazole not on the list.  I thibk she is on 10 mg   And if so  Can try 1/2 dose  5 mg per day and see if better If these abnormal clinical findings persist, appropriate workup will be completed. The patient understands that follow up is required to elucidate the situation. Continuing then  Stop and wil let new endocrine decide on  Next step.

## 2011-06-09 NOTE — Telephone Encounter (Signed)
Advised pt on Dr. Rosezella [place] recommendations.  Pt needs desperately to have knee replacement and is told by her orthopedist that her thyroid needs to be controlled before this can happen. Pt verified that she is on tapazole 10 mg at this time and will decrease to 5 mg.  Pt states she was under the impression that Dr. Fabian Sharp was going to get her a new endocrinologist.  Pls advise.

## 2011-06-10 NOTE — Telephone Encounter (Signed)
Called pt to make aware that her information was faxed to [facility] on June 02, 2011 and that the office will contact her directly to set up an appt.

## 2011-06-18 ENCOUNTER — Other Ambulatory Visit (HOSPITAL_COMMUNITY): Payer: Self-pay | Admitting: Internal Medicine

## 2011-06-18 DIAGNOSIS — E0590 Thyrotoxicosis, unspecified without thyrotoxic crisis or storm: Secondary | ICD-10-CM

## 2011-06-25 ENCOUNTER — Encounter (HOSPITAL_COMMUNITY)
Admission: RE | Admit: 2011-06-25 | Discharge: 2011-06-25 | Disposition: A | Discharge status: Home / Self Care | Payer: Medicare Other | Source: Ambulatory Visit | Attending: Internal Medicine | Admitting: Internal Medicine

## 2011-06-25 DIAGNOSIS — E0590 Thyrotoxicosis, unspecified without thyrotoxic crisis or storm: Secondary | ICD-10-CM | POA: Insufficient documentation

## 2011-06-25 MED ORDER — SODIUM IODIDE I 131 CAPSULE
19.9000 | Freq: Once | INTRAVENOUS | Status: AC | PRN
  Administered 2011-06-25: 19.9 via ORAL

## 2011-07-30 ENCOUNTER — Other Ambulatory Visit (INDEPENDENT_AMBULATORY_CARE_PROVIDER_SITE_OTHER): Payer: Medicare Other

## 2011-07-30 ENCOUNTER — Telehealth: Payer: Self-pay | Admitting: Internal Medicine

## 2011-07-30 DIAGNOSIS — E0590 Thyrotoxicosis, unspecified without thyrotoxic crisis or storm: Secondary | ICD-10-CM

## 2011-07-30 DIAGNOSIS — R4586 Emotional lability: Secondary | ICD-10-CM

## 2011-07-30 DIAGNOSIS — E1159 Type 2 diabetes mellitus with other circulatory complications: Secondary | ICD-10-CM

## 2011-07-30 DIAGNOSIS — F17200 Nicotine dependence, unspecified, uncomplicated: Secondary | ICD-10-CM

## 2011-07-30 DIAGNOSIS — I739 Peripheral vascular disease, unspecified: Secondary | ICD-10-CM

## 2011-07-30 DIAGNOSIS — E1151 Type 2 diabetes mellitus with diabetic peripheral angiopathy without gangrene: Secondary | ICD-10-CM

## 2011-07-30 DIAGNOSIS — R634 Abnormal weight loss: Secondary | ICD-10-CM

## 2011-07-30 DIAGNOSIS — E119 Type 2 diabetes mellitus without complications: Secondary | ICD-10-CM

## 2011-07-30 DIAGNOSIS — I798 Other disorders of arteries, arterioles and capillaries in diseases classified elsewhere: Secondary | ICD-10-CM

## 2011-07-30 LAB — BASIC METABOLIC PANEL
BUN: 27 mg/dL — ABNORMAL HIGH (ref 6–23)
CO2: 32 mEq/L (ref 19–32)
Calcium: 10.5 mg/dL (ref 8.4–10.5)
Creatinine, Ser: 0.6 mg/dL (ref 0.4–1.2)
GFR: 102.87 mL/min (ref >60.00)
Glucose, Bld: 143 mg/dL — ABNORMAL HIGH (ref 70–99)
Sodium: 144 mEq/L (ref 135–145)

## 2011-07-30 LAB — CBC WITH DIFFERENTIAL/PLATELET
Basophils Absolute: 0 10*3/uL (ref 0.0–0.1)
Basophils Relative: 0.6 % (ref 0.0–3.0)
Eosinophils Absolute: 0.2 10*3/uL (ref 0.0–0.7)
Hemoglobin: 13.3 g/dL (ref 12.0–15.0)
Lymphs Abs: 1.4 10*3/uL (ref 0.7–4.0)
MCHC: 33.6 g/dL (ref 30.0–36.0)
MCV: 92.6 fl (ref 78.0–100.0)
Monocytes Absolute: 0.6 10*3/uL (ref 0.1–1.0)
Neutro Abs: 4.3 10*3/uL (ref 1.4–7.7)
RBC: 4.28 Mil/uL (ref 3.87–5.11)
RDW: 13.1 % (ref 11.5–14.6)

## 2011-08-06 ENCOUNTER — Encounter: Payer: Self-pay | Admitting: Internal Medicine

## 2011-08-06 ENCOUNTER — Ambulatory Visit (INDEPENDENT_AMBULATORY_CARE_PROVIDER_SITE_OTHER): Payer: Medicare Other | Admitting: Internal Medicine

## 2011-08-06 VITALS — BP 120/68 | HR 69 | Temp 98.5°F | Wt 127.0 lb

## 2011-08-06 DIAGNOSIS — Z9889 Other specified postprocedural states: Secondary | ICD-10-CM

## 2011-08-06 DIAGNOSIS — E0590 Thyrotoxicosis, unspecified without thyrotoxic crisis or storm: Secondary | ICD-10-CM

## 2011-08-06 DIAGNOSIS — I798 Other disorders of arteries, arterioles and capillaries in diseases classified elsewhere: Secondary | ICD-10-CM

## 2011-08-06 DIAGNOSIS — I779 Disorder of arteries and arterioles, unspecified: Secondary | ICD-10-CM

## 2011-08-06 DIAGNOSIS — E1151 Type 2 diabetes mellitus with diabetic peripheral angiopathy without gangrene: Secondary | ICD-10-CM

## 2011-08-06 DIAGNOSIS — E1159 Type 2 diabetes mellitus with other circulatory complications: Secondary | ICD-10-CM

## 2011-08-06 DIAGNOSIS — F17200 Nicotine dependence, unspecified, uncomplicated: Secondary | ICD-10-CM

## 2011-08-06 DIAGNOSIS — M1710 Unilateral primary osteoarthritis, unspecified knee: Secondary | ICD-10-CM

## 2011-08-06 DIAGNOSIS — Z01818 Encounter for other preprocedural examination: Secondary | ICD-10-CM

## 2011-08-06 DIAGNOSIS — I10 Essential (primary) hypertension: Secondary | ICD-10-CM

## 2011-08-06 DIAGNOSIS — M1712 Unilateral primary osteoarthritis, left knee: Secondary | ICD-10-CM

## 2011-08-06 DIAGNOSIS — IMO0002 Reserved for concepts with insufficient information to code with codable children: Secondary | ICD-10-CM

## 2011-08-06 MED ORDER — GLYBURIDE-METFORMIN 1.25-250 MG PO TABS: 1.0000 | ORAL_TABLET | Freq: Two times a day (BID) | ORAL | Status: DC

## 2011-08-06 NOTE — Progress Notes (Signed)
Subjective:    Patient ID: Jordan [name], female    DOB: Oct 17, 1940, 71 y.o.   MRN: [id number]  HPI Patient comes in today for follow up of  multiple medical problems.  Since last visit :  Hyperthyroid: rai  In April? And   2 more tests  show still elevated thyroid but watching to go down and in the meantime put on b blocker which has helped some fatigue and other sx.   No se of this  Was toled to stay on this through surgery and should do ok.has fu in August.   Weight loss has stopped ans emotional lability is some better.    DM  Sugars ok no change in meds or diet. Needs refill meds   Vascular : no cns sx and no change in claudication sx. Activity limited by knee   COPD:  Still smoking and no sx at present but not that active from above disease situation.   Ht stable  Readings and no se of meds  preoperative clearance to have left tkr on July 8th per dr Thurston Hole. For DJD left knee.    Review of Systems Neg fever cp palpitations syncope cns sx weakness vision changes rest as per hpi   Past history family history social history reviewed in the electronic medical record. Outpatient Encounter Prescriptions as of 08/06/2011  Medication Sig Dispense Refill  . aspirin 81 MG tablet Take 81 mg by mouth daily.       Marland Kitchen atenolol (TENORMIN) 25 MG tablet Take 25 mg by mouth daily.      . calcium carbonate (OS-CAL - DOSED IN MG OF ELEMENTAL CALCIUM) 1250 MG tablet Take 1 tablet by mouth daily.        . Cholecalciferol (VITAMIN D3) 2000 UNITS TABS Take 2,000 Units by mouth daily.       . CRESTOR 10 MG tablet TAKE 1 TABLET BY MOUTH EVERY DAY  Taking 5 mg   90 tablet  0  . fish oil-omega-3 fatty acids 1000 MG capsule Take 2 g by mouth daily.       Marland Kitchen glucose blood (ACCU-CHEK COMPACT TEST DRUM) test strip 1 each by Other route as needed. Use as instructed       . glyBURIDE-metformin (GLUCOVANCE) 1.25-250 MG per tablet Take 1 tablet by mouth 2 (two) times daily with a meal.  180 tablet  3  .  lisinopril-hydrochlorothiazide (PRINZIDE,ZESTORETIC) 20-12.5 MG per tablet TAKE 2 TABLETS BY MOUTH EVERY DAY  180 tablet  2  . LYSINE PO Take 1 tablet by mouth daily.        . MULTIPLE VITAMIN PO Take by mouth.        .        . DISCONTD: glyBURIDE-metformin (GLUCOVANCE) 1.25-250 MG per tablet TAKE 1 TABLET BY MOUTH TWICE DAILY  180 tablet  0  .        . docusate sodium (COLACE) 100 MG capsule Take 100 mg by mouth 2 (two) times daily. 1 qhs       . oxyCODONE (OXYCONTIN) 20 MG 12 hr tablet Take 20 mg by mouth every 12 (twelve) hours. 1 qhs   not taking cause not helpful         Objective:   Physical Exam BP 120/68  Pulse 69  Temp(Src) 98.5 F (36.9 C) (Oral)  Wt 127 lb (57.607 kg)  SpO2 97% WDWN in nad  Looks well today  HEENT Morgan eyes clear tms intact Op clear tongue  midline Neck: Supple without adenopathy non tender thyroid palpable  Right  Bruits Chest:  Clear to A&P without wheezes rales or rhonchi CV:  S1-S2 no gallops or murmurs peripheral perfusion is normal Abdomen:  Sof,t normal bowel sounds without hepatosplenomegaly, no guarding rebound or masses no CVA tenderness extr  Bilateral femoral bruits  Pulses present  Dec left foot  Nl cap refillslight feet edema  No ulcers or lesion  Skin: normal capillary refill ,turgor , color: No acute rashes ,petechiae or bruising  NEURO: oriented x 3 CN 3-12 appear intact except hearing not checked . No focal muscle weakness or atrophy. Gait WNL.  With slight antalgic  Grossly non focal. No tremor or abnormal movement.  Lab Results  Component Value Date   WBC 6.5 07/30/2011   HGB 13.3 07/30/2011   HCT 39.6 07/30/2011   PLT 188.0 07/30/2011   GLUCOSE 143* 07/30/2011   CHOL 139 01/22/2011   TRIG 200.0* 01/22/2011   HDL 44.60 01/22/2011   LDLCALC 54 01/22/2011   ALT 20 01/22/2011   AST 18 01/22/2011   NA 144 07/30/2011   K 3.7 07/30/2011   CL 105 07/30/2011   CREATININE 0.6 07/30/2011   BUN 27* 07/30/2011   CO2 32 07/30/2011   TSH 0.04*  07/30/2011   HGBA1C 6.6* 07/30/2011   MICROALBUR 0.9 01/22/2011        Assessment & Plan:  Hyperthyroid  Sp rai  Transition  Now on sx rx    b blocker feeling some better  No flare of copd. Vascular disease  pvd and carotid  Non criticalclincally or when last checked   Stable claudication. No heart or lung sx  Or active progressing sx .   Marland Kitchen Had back surgery in November and no cv changes since that time  Last carotid doppler 11 12  COPD tobacco: continues   Advised to stop.  DM acceptable control on orals and diet  No renal insufficiency  Hypertension: acceptable control DJD knee pre op evaluation    No contraindication clearance for surgery   Will review record   Reviewed  What available in recent EHR .   To be sent to Dr Clide Cliff  Office.   Has ? About anticoagulation  Advised standard of care and disc with surgery also.     Total visit 36 mins > 50% spent counseling and coordinating care

## 2011-08-06 NOTE — Patient Instructions (Signed)
Will send a copy of labs to Dr Sharl Ma.  Will send  Notification  To Dr Thurston Hole  About surgery clearance evaluation. You have no active vascular/ pulmonary disease and are now an acceptable candidate to go ahead with surgery. YOur diabetes is controlled today. As well as blood pressure.  As always  Stopping smoking will   Improve your health.

## 2011-08-10 ENCOUNTER — Encounter: Payer: Self-pay | Admitting: Internal Medicine

## 2011-08-10 DIAGNOSIS — Z01818 Encounter for other preprocedural examination: Secondary | ICD-10-CM | POA: Insufficient documentation

## 2011-08-10 DIAGNOSIS — Z9889 Other specified postprocedural states: Secondary | ICD-10-CM | POA: Insufficient documentation

## 2011-08-10 DIAGNOSIS — M1712 Unilateral primary osteoarthritis, left knee: Secondary | ICD-10-CM | POA: Insufficient documentation

## 2011-09-09 ENCOUNTER — Other Ambulatory Visit (HOSPITAL_COMMUNITY): Payer: Medicare Other

## 2011-09-10 ENCOUNTER — Encounter: Payer: Self-pay | Admitting: Internal Medicine

## 2011-09-10 ENCOUNTER — Ambulatory Visit (INDEPENDENT_AMBULATORY_CARE_PROVIDER_SITE_OTHER): Payer: Medicare Other | Admitting: Internal Medicine

## 2011-09-10 VITALS — BP 144/70 | HR 74 | Temp 98.1°F | Wt 125.0 lb

## 2011-09-10 DIAGNOSIS — M1710 Unilateral primary osteoarthritis, unspecified knee: Secondary | ICD-10-CM

## 2011-09-10 DIAGNOSIS — R208 Other disturbances of skin sensation: Secondary | ICD-10-CM

## 2011-09-10 DIAGNOSIS — I10 Essential (primary) hypertension: Secondary | ICD-10-CM

## 2011-09-10 DIAGNOSIS — M1712 Unilateral primary osteoarthritis, left knee: Secondary | ICD-10-CM

## 2011-09-10 DIAGNOSIS — R209 Unspecified disturbances of skin sensation: Secondary | ICD-10-CM

## 2011-09-10 DIAGNOSIS — E119 Type 2 diabetes mellitus without complications: Secondary | ICD-10-CM

## 2011-09-10 DIAGNOSIS — E0590 Thyrotoxicosis, unspecified without thyrotoxic crisis or storm: Secondary | ICD-10-CM

## 2011-09-10 NOTE — Patient Instructions (Signed)
Uncertain why you have the arm discomfort.   However your exam is good today.  Contact us for reevaluation if progressive or worsening or new sx that you cant explain.

## 2011-09-10 NOTE — Progress Notes (Signed)
Subjective:    Patient ID: Jordan [name], female    DOB: 06-20-1940, 71 y.o.   MRN: [id number]  HPI Patient comes in today for SDA for  new problem evaluation. Onset left forearm for about the last 3 weeks of an uncomfortable but not really painful sensation that just appeared one day on her left forearm  Noassociation. With motion injury sleep. NO neck shoulder and arm sx.  Using lotion on it but no rx and if  Not worse with touching about a 3  And continuous irritation  Always there but waxes and wanes.  Sugery postponted to August 3rd.    Review of Systems Neg cp change in exercise HA neck pain rash  Knee still an issues  Had tsh done today  No falling  Past history family history social history reviewed in the electronic medical record. Outpatient Encounter Prescriptions as of 09/10/2011  Medication Sig Dispense Refill  . aspirin 81 MG tablet Take 81 mg by mouth daily.       Marland Kitchen atenolol (TENORMIN) 25 MG tablet Take 25 mg by mouth daily.      . calcium carbonate (OS-CAL - DOSED IN MG OF ELEMENTAL CALCIUM) 1250 MG tablet Take 1 tablet by mouth daily.        . Cholecalciferol (VITAMIN D3) 2000 UNITS TABS Take 2,000 Units by mouth daily.       . CRESTOR 10 MG tablet TAKE 1 TABLET BY MOUTH EVERY DAY  90 tablet  0  . fish oil-omega-3 fatty acids 1000 MG capsule Take 2 g by mouth daily.       Marland Kitchen glucose blood (ACCU-CHEK COMPACT TEST DRUM) test strip 1 each by Other route as needed. Use as instructed       . glyBURIDE-metformin (GLUCOVANCE) 1.25-250 MG per tablet Take 1 tablet by mouth 2 (two) times daily with a meal.  180 tablet  3  . lisinopril-hydrochlorothiazide (PRINZIDE,ZESTORETIC) 20-12.5 MG per tablet TAKE 2 TABLETS BY MOUTH EVERY DAY  180 tablet  2  . LYSINE PO Take 1 tablet by mouth daily.        . MULTIPLE VITAMIN PO Take by mouth.        . rosuvastatin (CRESTOR) 5 MG tablet Take 5 mg by mouth daily.        Marland Kitchen DISCONTD: diazepam (VALIUM) 2 MG tablet Take 2 mg by mouth every 6  (six) hours as needed. 1 qhs        . DISCONTD: docusate sodium (COLACE) 100 MG capsule Take 100 mg by mouth 2 (two) times daily. 1 qhs       . DISCONTD: oxyCODONE (OXYCONTIN) 20 MG 12 hr tablet Take 20 mg by mouth every 12 (twelve) hours. 1 qhs             Objective:   Physical Exam  BP 144/70  Pulse 74  Temp 98.1 F (36.7 C) (Oral)  Wt 125 lb (56.7 kg)  SpO2 97%  WDWN in nad looks well slender  Limps  LEFT UE   Nl by inspection points to left forearm area about 4 cm no rash or tenderness small 1-2  Bump but not tender Motor  Intact and full rom and no joint effusion. Neck no masses or adenopathy.     Assessment & Plan:   Dysesthesia  Local uncertain cause  Essentially normal exam.  No joint assoication  Area over the tendon but no tendinitis sx.   And no obv radicular sx or neck  pain.   For now will observe and fu if  persistent , progressive  Or other unexplained sx .   dsic rx and topicals  Disc  .   DM adequate  Not typical of DM neuropathy and in reasonable control and no feet sx with this. Hyperthyroid sp rai     DJD knee  Waiting for surgery until thyroid is under control.

## 2011-10-03 ENCOUNTER — Encounter (HOSPITAL_COMMUNITY): Payer: Self-pay | Admitting: Pharmacy Technician

## 2011-10-09 ENCOUNTER — Other Ambulatory Visit: Payer: Self-pay | Admitting: Physician Assistant

## 2011-10-09 DIAGNOSIS — M1712 Unilateral primary osteoarthritis, left knee: Secondary | ICD-10-CM | POA: Insufficient documentation
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
## 2011-10-15 ENCOUNTER — Encounter (HOSPITAL_COMMUNITY): Payer: Self-pay

## 2011-10-15 ENCOUNTER — Encounter (HOSPITAL_COMMUNITY)
Admission: RE | Admit: 2011-10-15 | Discharge: 2011-10-15 | Disposition: A | Discharge status: Home / Self Care | Payer: Medicare Other | Source: Ambulatory Visit | Attending: Orthopedic Surgery | Admitting: Orthopedic Surgery

## 2011-10-15 LAB — URINALYSIS, ROUTINE W REFLEX MICROSCOPIC
Bilirubin Urine: NEGATIVE
Glucose, UA: NEGATIVE mg/dL
Hgb urine dipstick: NEGATIVE
Ketones, ur: NEGATIVE mg/dL
Leukocytes, UA: NEGATIVE
Protein, ur: NEGATIVE mg/dL
pH: 7.5 (ref 5.0–8.0)

## 2011-10-15 LAB — CBC
HCT: 39.2 % (ref 36.0–46.0)
MCV: 89.3 fL (ref 78.0–100.0)
RBC: 4.39 MIL/uL (ref 3.87–5.11)
RDW: 12.5 % (ref 11.5–15.5)
WBC: 7 10*3/uL (ref 4.0–10.5)

## 2011-10-15 LAB — COMPREHENSIVE METABOLIC PANEL
ALT: 13 U/L (ref 0–35)
Albumin: 3.6 g/dL (ref 3.5–5.2)
Alkaline Phosphatase: 85 U/L (ref 39–117)
BUN: 24 mg/dL — ABNORMAL HIGH (ref 6–23)
Chloride: 102 mEq/L (ref 96–112)
GFR calc Af Amer: >90 mL/min (ref >90)
Glucose, Bld: 197 mg/dL — ABNORMAL HIGH (ref 70–99)
Potassium: 3.7 mEq/L (ref 3.5–5.1)
Sodium: 142 mEq/L (ref 135–145)
Total Bilirubin: 0.4 mg/dL (ref 0.3–1.2)
Total Protein: 6.8 g/dL (ref 6.0–8.3)

## 2011-10-15 LAB — PROTIME-INR: Prothrombin Time: 13.5 seconds (ref 11.6–15.2)

## 2011-10-15 LAB — SURGICAL PCR SCREEN
MRSA, PCR: NEGATIVE
Staphylococcus aureus: NEGATIVE

## 2011-10-15 NOTE — Progress Notes (Signed)
EKG to be reviewed by anesthesia. Pt of Dr. Fabian Sharp at [place], no old EKG in [facility] or [facility].

## 2011-10-15 NOTE — Progress Notes (Signed)
Blood bank called, pt is positive for antibodies and needs repeat T&S DOS.

## 2011-10-15 NOTE — Pre-Procedure Instructions (Signed)
20 Jordan [name]  10/15/2011   Your procedure is scheduled on:  Monday August 12  Report to [facility] at 5:30 AM.  Call this number if you have problems the morning of surgery: [phone]   Remember:   Do not eat food:After Midnight.  May have clear liquids:until Midnight .  Clear liquids include soda, tea, black coffee, apple or grape juice, broth.  Take these medicines the morning of surgery with A SIP OF WATER: Atenolol   Do not wear jewelry, make-up or nail polish.  Do not wear lotions, powders, or perfumes. You may wear deodorant.  Do not shave 48 hours prior to surgery. Men may shave face and neck.  Do not bring valuables to the hospital.  Contacts, dentures or bridgework may not be worn into surgery.  Leave suitcase in the car. After surgery it may be brought to your room.  For patients admitted to the hospital, checkout time is 11:00 AM the day of discharge.   Patients discharged the day of surgery will not be allowed to drive home.  Name and phone number of your driver: NA  Special Instructions: Incentive Spirometry - Practice and bring it with you on the day of surgery. and CHG Shower Use Special Wash: 1/2 bottle night before surgery and 1/2 bottle morning of surgery.   Please read over the following fact sheets that you were given: Pain Booklet, Coughing and Deep Breathing, Blood Transfusion Information, Total Joint Packet and Surgical Site Infection Prevention

## 2011-10-16 ENCOUNTER — Other Ambulatory Visit: Payer: Self-pay | Admitting: Physician Assistant

## 2011-10-16 ENCOUNTER — Encounter (HOSPITAL_COMMUNITY): Payer: Self-pay | Admitting: Vascular Surgery

## 2011-10-16 LAB — URINE CULTURE
Colony Count: NO GROWTH
Culture: NO GROWTH
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
## 2011-10-19 HISTORY — PX: JOINT REPLACEMENT: SHX530

## 2011-10-19 MED ORDER — CHLORHEXIDINE GLUCONATE 4 % EX LIQD: 60.0000 mL | Freq: Once | CUTANEOUS | Status: DC

## 2011-10-19 MED ORDER — LACTATED RINGERS IV SOLN: INTRAVENOUS | Status: DC

## 2011-10-19 MED ORDER — POVIDONE-IODINE 7.5 % EX SOLN
Freq: Once | CUTANEOUS | Status: DC
  Filled 2011-10-19: qty 118

## 2011-10-19 MED ORDER — CEFAZOLIN SODIUM-DEXTROSE 2-3 GM-% IV SOLR
2.0000 g | INTRAVENOUS | Status: DC
  Filled 2011-10-19: qty 50

## 2011-10-20 ENCOUNTER — Encounter (HOSPITAL_COMMUNITY): Payer: Self-pay | Admitting: Vascular Surgery

## 2011-10-20 ENCOUNTER — Inpatient Hospital Stay (HOSPITAL_COMMUNITY)
Admission: RE | Admit: 2011-10-20 | Discharge: 2011-10-22 | DRG: 470 | Disposition: A | Discharge status: Home Health | Payer: Medicare Other | Source: Ambulatory Visit | Attending: Orthopedic Surgery | Admitting: Orthopedic Surgery

## 2011-10-20 ENCOUNTER — Ambulatory Visit (HOSPITAL_COMMUNITY): Payer: Medicare Other | Admitting: Vascular Surgery

## 2011-10-20 ENCOUNTER — Encounter (HOSPITAL_COMMUNITY)
Admission: RE | Disposition: A | Discharge status: Home Health | Payer: Self-pay | Source: Ambulatory Visit | Attending: Orthopedic Surgery

## 2011-10-20 DIAGNOSIS — F17200 Nicotine dependence, unspecified, uncomplicated: Secondary | ICD-10-CM | POA: Diagnosis present

## 2011-10-20 DIAGNOSIS — M949 Disorder of cartilage, unspecified: Secondary | ICD-10-CM | POA: Diagnosis present

## 2011-10-20 DIAGNOSIS — M810 Age-related osteoporosis without current pathological fracture: Secondary | ICD-10-CM | POA: Diagnosis present

## 2011-10-20 DIAGNOSIS — M1710 Unilateral primary osteoarthritis, unspecified knee: Principal | ICD-10-CM | POA: Diagnosis present

## 2011-10-20 DIAGNOSIS — M1712 Unilateral primary osteoarthritis, left knee: Secondary | ICD-10-CM | POA: Diagnosis present

## 2011-10-20 DIAGNOSIS — M899 Disorder of bone, unspecified: Secondary | ICD-10-CM | POA: Diagnosis present

## 2011-10-20 DIAGNOSIS — I739 Peripheral vascular disease, unspecified: Secondary | ICD-10-CM | POA: Diagnosis present

## 2011-10-20 DIAGNOSIS — Z01812 Encounter for preprocedural laboratory examination: Secondary | ICD-10-CM

## 2011-10-20 DIAGNOSIS — J4489 Other specified chronic obstructive pulmonary disease: Secondary | ICD-10-CM | POA: Diagnosis present

## 2011-10-20 DIAGNOSIS — I10 Essential (primary) hypertension: Secondary | ICD-10-CM | POA: Diagnosis present

## 2011-10-20 DIAGNOSIS — D62 Acute posthemorrhagic anemia: Secondary | ICD-10-CM | POA: Diagnosis not present

## 2011-10-20 DIAGNOSIS — J449 Chronic obstructive pulmonary disease, unspecified: Secondary | ICD-10-CM | POA: Diagnosis present

## 2011-10-20 DIAGNOSIS — E119 Type 2 diabetes mellitus without complications: Secondary | ICD-10-CM | POA: Diagnosis present

## 2011-10-20 DIAGNOSIS — E785 Hyperlipidemia, unspecified: Secondary | ICD-10-CM | POA: Diagnosis present

## 2011-10-20 HISTORY — PX: TOTAL KNEE ARTHROPLASTY: SHX125

## 2011-10-20 LAB — GLUCOSE, CAPILLARY
Glucose-Capillary: 80 mg/dL (ref 70–99)
Glucose-Capillary: 180 mg/dL — ABNORMAL HIGH (ref 70–99)
Glucose-Capillary: 138 mg/dL — ABNORMAL HIGH (ref 70–99)

## 2011-10-20 LAB — TYPE AND SCREEN

## 2011-10-20 SURGERY — ARTHROPLASTY, KNEE, TOTAL
Anesthesia: General | Site: Knee | Laterality: Left | Wound class: Clean

## 2011-10-20 MED ORDER — PHENOL 1.4 % MT LIQD: 1.0000 | OROMUCOSAL | Status: DC | PRN

## 2011-10-20 MED ORDER — GLYBURIDE-METFORMIN 1.25-250 MG PO TABS: 1.0000 | ORAL_TABLET | Freq: Two times a day (BID) | ORAL | Status: DC

## 2011-10-20 MED ORDER — CEFUROXIME SODIUM 1.5 G IJ SOLR
INTRAMUSCULAR | Status: AC
  Filled 2011-10-20: qty 1.5

## 2011-10-20 MED ORDER — ONDANSETRON HCL 4 MG/2ML IJ SOLN
INTRAMUSCULAR | Status: DC | PRN
  Administered 2011-10-20: 4 mg via INTRAVENOUS

## 2011-10-20 MED ORDER — ONDANSETRON HCL 4 MG/2ML IJ SOLN: 4.0000 mg | Freq: Once | INTRAMUSCULAR | Status: DC | PRN

## 2011-10-20 MED ORDER — GLYBURIDE 1.25 MG PO TABS
1.2500 mg | ORAL_TABLET | Freq: Two times a day (BID) | ORAL | Status: DC
  Administered 2011-10-22 (×2): 1.25 mg via ORAL
  Filled 2011-10-20 (×3): qty 1

## 2011-10-20 MED ORDER — OXYCODONE HCL 5 MG PO TABS
ORAL_TABLET | ORAL | Status: AC
  Filled 2011-10-20: qty 1

## 2011-10-20 MED ORDER — INSULIN ASPART 100 UNIT/ML ~~LOC~~ SOLN
0.0000 [IU] | Freq: Three times a day (TID) | SUBCUTANEOUS | Status: DC
  Administered 2011-10-20: 2 [IU] via SUBCUTANEOUS
  Administered 2011-10-21: 5 [IU] via SUBCUTANEOUS
  Administered 2011-10-21: 3 [IU] via SUBCUTANEOUS
  Administered 2011-10-21: 2 [IU] via SUBCUTANEOUS
  Administered 2011-10-22: 3 [IU] via SUBCUTANEOUS
  Administered 2011-10-22: 2 [IU] via SUBCUTANEOUS

## 2011-10-20 MED ORDER — ATORVASTATIN CALCIUM 10 MG PO TABS
10.0000 mg | ORAL_TABLET | Freq: Every day | ORAL | Status: DC
  Administered 2011-10-20 – 2011-10-22 (×2): 10 mg via ORAL
  Filled 2011-10-20 (×3): qty 1

## 2011-10-20 MED ORDER — ENOXAPARIN SODIUM 30 MG/0.3ML ~~LOC~~ SOLN
30.0000 mg | Freq: Two times a day (BID) | SUBCUTANEOUS | Status: DC
  Administered 2011-10-21 – 2011-10-22 (×3): 30 mg via SUBCUTANEOUS
  Filled 2011-10-20 (×5): qty 0.3

## 2011-10-20 MED ORDER — METOCLOPRAMIDE HCL 10 MG PO TABS
5.0000 mg | ORAL_TABLET | Freq: Three times a day (TID) | ORAL | Status: DC | PRN
  Administered 2011-10-21: 10 mg via ORAL
  Filled 2011-10-20: qty 1

## 2011-10-20 MED ORDER — LYSINE 1000 MG PO TABS: 1000.0000 mg | ORAL_TABLET | Freq: Every day | ORAL | Status: DC

## 2011-10-20 MED ORDER — LACTATED RINGERS IV SOLN
INTRAVENOUS | Status: DC | PRN
  Administered 2011-10-20 (×2): via INTRAVENOUS

## 2011-10-20 MED ORDER — LABETALOL HCL 5 MG/ML IV SOLN
INTRAVENOUS | Status: DC | PRN
  Administered 2011-10-20: 5 mg via INTRAVENOUS

## 2011-10-20 MED ORDER — BUPIVACAINE-EPINEPHRINE PF 0.25-1:200000 % IJ SOLN
INTRAMUSCULAR | Status: AC
  Filled 2011-10-20: qty 30

## 2011-10-20 MED ORDER — SODIUM CHLORIDE 0.9 % IR SOLN
Status: DC | PRN
  Administered 2011-10-20: 3000 mL
  Administered 2011-10-20: 1000 mL

## 2011-10-20 MED ORDER — CELECOXIB 200 MG PO CAPS
200.0000 mg | ORAL_CAPSULE | Freq: Two times a day (BID) | ORAL | Status: DC
  Administered 2011-10-20 – 2011-10-22 (×5): 200 mg via ORAL
  Filled 2011-10-20 (×6): qty 1

## 2011-10-20 MED ORDER — BISACODYL 5 MG PO TBEC
10.0000 mg | DELAYED_RELEASE_TABLET | Freq: Every day | ORAL | Status: DC
  Administered 2011-10-20: 10 mg via ORAL
  Filled 2011-10-20: qty 2

## 2011-10-20 MED ORDER — LABETALOL HCL 5 MG/ML IV SOLN
5.0000 mg | INTRAVENOUS | Status: DC | PRN
  Administered 2011-10-20 (×2): 5 mg via INTRAVENOUS

## 2011-10-20 MED ORDER — LISINOPRIL 20 MG PO TABS
20.0000 mg | ORAL_TABLET | Freq: Every day | ORAL | Status: DC
  Administered 2011-10-20 – 2011-10-22 (×3): 20 mg via ORAL
  Filled 2011-10-20 (×3): qty 1

## 2011-10-20 MED ORDER — VITAMIN D3 50 MCG (2000 UT) PO TABS: 1.0000 | ORAL_TABLET | Freq: Every day | ORAL | Status: DC

## 2011-10-20 MED ORDER — HYDROMORPHONE HCL PF 1 MG/ML IJ SOLN
INTRAMUSCULAR | Status: AC
  Filled 2011-10-20: qty 1

## 2011-10-20 MED ORDER — MENTHOL 3 MG MT LOZG: 1.0000 | LOZENGE | OROMUCOSAL | Status: DC | PRN

## 2011-10-20 MED ORDER — INSULIN ASPART 100 UNIT/ML ~~LOC~~ SOLN: 0.0000 [IU] | Freq: Every day | SUBCUTANEOUS | Status: DC

## 2011-10-20 MED ORDER — ONDANSETRON HCL 4 MG PO TABS
4.0000 mg | ORAL_TABLET | Freq: Four times a day (QID) | ORAL | Status: DC | PRN
  Administered 2011-10-21: 4 mg via ORAL
  Filled 2011-10-20: qty 1

## 2011-10-20 MED ORDER — ADULT MULTIVITAMIN W/MINERALS CH
1.0000 | ORAL_TABLET | Freq: Every day | ORAL | Status: DC
  Administered 2011-10-20 – 2011-10-22 (×3): 1 via ORAL
  Filled 2011-10-20 (×3): qty 1

## 2011-10-20 MED ORDER — LIDOCAINE HCL (CARDIAC) 20 MG/ML IV SOLN
INTRAVENOUS | Status: DC | PRN
  Administered 2011-10-20: 80 mg via INTRAVENOUS

## 2011-10-20 MED ORDER — MIDAZOLAM HCL 5 MG/5ML IJ SOLN
INTRAMUSCULAR | Status: DC | PRN
  Administered 2011-10-20 (×2): 1 mg via INTRAVENOUS

## 2011-10-20 MED ORDER — DOCUSATE SODIUM 100 MG PO CAPS
100.0000 mg | ORAL_CAPSULE | Freq: Two times a day (BID) | ORAL | Status: DC
  Administered 2011-10-20 – 2011-10-22 (×5): 100 mg via ORAL
  Filled 2011-10-20 (×5): qty 1

## 2011-10-20 MED ORDER — ONDANSETRON HCL 4 MG/2ML IJ SOLN
4.0000 mg | Freq: Four times a day (QID) | INTRAMUSCULAR | Status: DC | PRN
  Administered 2011-10-21: 4 mg via INTRAVENOUS
  Filled 2011-10-20: qty 2

## 2011-10-20 MED ORDER — ACETAMINOPHEN 325 MG PO TABS
650.0000 mg | ORAL_TABLET | Freq: Four times a day (QID) | ORAL | Status: DC | PRN
  Administered 2011-10-22: 650 mg via ORAL
  Filled 2011-10-20: qty 2

## 2011-10-20 MED ORDER — CALCIUM CARBONATE 600 MG PO TABS: 600.0000 mg | ORAL_TABLET | Freq: Every day | ORAL | Status: DC

## 2011-10-20 MED ORDER — BUPIVACAINE-EPINEPHRINE PF 0.5-1:200000 % IJ SOLN
INTRAMUSCULAR | Status: DC | PRN
  Administered 2011-10-20: 150 mg

## 2011-10-20 MED ORDER — OXYCODONE HCL 5 MG PO TABS
5.0000 mg | ORAL_TABLET | ORAL | Status: DC | PRN
  Administered 2011-10-20: 10 mg via ORAL
  Administered 2011-10-20: 5 mg via ORAL
  Administered 2011-10-20 – 2011-10-22 (×8): 10 mg via ORAL
  Filled 2011-10-20 (×9): qty 2

## 2011-10-20 MED ORDER — GLYCOPYRROLATE 0.2 MG/ML IJ SOLN
INTRAMUSCULAR | Status: DC | PRN
  Administered 2011-10-20: 0.2 mg via INTRAVENOUS

## 2011-10-20 MED ORDER — CEFAZOLIN SODIUM 1-5 GM-% IV SOLN
1.0000 g | Freq: Four times a day (QID) | INTRAVENOUS | Status: AC
  Administered 2011-10-20 (×2): 1 g via INTRAVENOUS
  Filled 2011-10-20 (×2): qty 50

## 2011-10-20 MED ORDER — PROPOFOL 10 MG/ML IV BOLUS
INTRAVENOUS | Status: DC | PRN
  Administered 2011-10-20: 80 mg via INTRAVENOUS

## 2011-10-20 MED ORDER — CEFAZOLIN SODIUM 1-5 GM-% IV SOLN
INTRAVENOUS | Status: DC | PRN
  Administered 2011-10-20: 2 g via INTRAVENOUS

## 2011-10-20 MED ORDER — POTASSIUM CHLORIDE IN NACL 20-0.9 MEQ/L-% IV SOLN
INTRAVENOUS | Status: DC
  Administered 2011-10-20 – 2011-10-21 (×2): via INTRAVENOUS
  Filled 2011-10-20 (×8): qty 1000

## 2011-10-20 MED ORDER — HYDROMORPHONE HCL PF 1 MG/ML IJ SOLN: 0.2500 mg | INTRAMUSCULAR | Status: DC | PRN

## 2011-10-20 MED ORDER — METFORMIN HCL 500 MG PO TABS
250.0000 mg | ORAL_TABLET | Freq: Two times a day (BID) | ORAL | Status: DC
  Administered 2011-10-22 (×2): 250 mg via ORAL
  Filled 2011-10-20 (×3): qty 1

## 2011-10-20 MED ORDER — CALCIUM CARBONATE 1250 (500 CA) MG PO TABS
1.0000 | ORAL_TABLET | Freq: Every day | ORAL | Status: DC
  Administered 2011-10-20 – 2011-10-22 (×3): 500 mg via ORAL
  Filled 2011-10-20 (×3): qty 1

## 2011-10-20 MED ORDER — METOCLOPRAMIDE HCL 5 MG/ML IJ SOLN: 5.0000 mg | Freq: Three times a day (TID) | INTRAMUSCULAR | Status: DC | PRN

## 2011-10-20 MED ORDER — BUPIVACAINE-EPINEPHRINE 0.25% -1:200000 IJ SOLN
INTRAMUSCULAR | Status: DC | PRN
  Administered 2011-10-20: 30 mL

## 2011-10-20 MED ORDER — ACETAMINOPHEN 650 MG RE SUPP: 650.0000 mg | Freq: Four times a day (QID) | RECTAL | Status: DC | PRN

## 2011-10-20 MED ORDER — HYDROMORPHONE HCL PF 1 MG/ML IJ SOLN
0.5000 mg | INTRAMUSCULAR | Status: DC | PRN
  Administered 2011-10-20 – 2011-10-21 (×9): 0.5 mg via INTRAVENOUS
  Filled 2011-10-20 (×9): qty 1

## 2011-10-20 MED ORDER — VITAMIN D3 25 MCG (1000 UNIT) PO TABS
2000.0000 [IU] | ORAL_TABLET | Freq: Every day | ORAL | Status: DC
  Administered 2011-10-20 – 2011-10-22 (×3): 2000 [IU] via ORAL
  Filled 2011-10-20 (×3): qty 2

## 2011-10-20 MED ORDER — CEFUROXIME SODIUM 1.5 G IJ SOLR
INTRAMUSCULAR | Status: DC | PRN
  Administered 2011-10-20: 1.5 g

## 2011-10-20 MED ORDER — ATENOLOL 25 MG PO TABS
25.0000 mg | ORAL_TABLET | Freq: Every day | ORAL | Status: DC
  Administered 2011-10-21 – 2011-10-22 (×2): 25 mg via ORAL
  Filled 2011-10-20 (×2): qty 1

## 2011-10-20 MED ORDER — FENTANYL CITRATE 0.05 MG/ML IJ SOLN
INTRAMUSCULAR | Status: DC | PRN
  Administered 2011-10-20 (×3): 25 ug via INTRAVENOUS
  Administered 2011-10-20: 50 ug via INTRAVENOUS
  Administered 2011-10-20: 25 ug via INTRAVENOUS
  Administered 2011-10-20 (×2): 50 ug via INTRAVENOUS

## 2011-10-20 MED ORDER — HYDROMORPHONE HCL PF 1 MG/ML IJ SOLN
0.2500 mg | INTRAMUSCULAR | Status: DC | PRN
  Administered 2011-10-20 (×2): 0.5 mg via INTRAVENOUS

## 2011-10-20 SURGICAL SUPPLY — 67 items
BANDAGE ELASTIC 6 VELCRO ST LF (GAUZE/BANDAGES/DRESSINGS) ×2 IMPLANT
BANDAGE ESMARK 6X9 LF (GAUZE/BANDAGES/DRESSINGS) ×1 IMPLANT
BLADE SAGITTAL 25.0X1.19X90 (BLADE) ×2 IMPLANT
BLADE SAW SGTL 11.0X1.19X90.0M (BLADE) IMPLANT
BLADE SAW SGTL 13.0X1.19X90.0M (BLADE) ×2 IMPLANT
BLADE SURG 10 STRL SS (BLADE) ×4 IMPLANT
BNDG ELASTIC 6X15 VLCR STRL LF (GAUZE/BANDAGES/DRESSINGS) ×2 IMPLANT
BNDG ESMARK 6X9 LF (GAUZE/BANDAGES/DRESSINGS) ×2
BOWL SMART MIX CTS (DISPOSABLE) ×2 IMPLANT
CEMENT HV SMART SET (Cement) ×4 IMPLANT
CLOTH BEACON ORANGE TIMEOUT ST (SAFETY) ×2 IMPLANT
COVER BACK TABLE 24X17X13 BIG (DRAPES) IMPLANT
COVER PROBE W GEL 5X96 (DRAPES) ×2 IMPLANT
COVER SURGICAL LIGHT HANDLE (MISCELLANEOUS) ×2 IMPLANT
CUFF TOURNIQUET SINGLE 34IN LL (TOURNIQUET CUFF) ×2 IMPLANT
CUFF TOURNIQUET SINGLE 44IN (TOURNIQUET CUFF) IMPLANT
DRAPE EXTREMITY T 121X128X90 (DRAPE) ×2 IMPLANT
DRAPE INCISE IOBAN 66X45 STRL (DRAPES) ×2 IMPLANT
DRAPE PROXIMA HALF (DRAPES) ×2 IMPLANT
DRAPE U-SHAPE 47X51 STRL (DRAPES) ×2 IMPLANT
DRSG ADAPTIC 3X8 NADH LF (GAUZE/BANDAGES/DRESSINGS) ×2 IMPLANT
DRSG PAD ABDOMINAL 8X10 ST (GAUZE/BANDAGES/DRESSINGS) ×2 IMPLANT
DURAPREP 26ML APPLICATOR (WOUND CARE) ×2 IMPLANT
ELECT CAUTERY BLADE 6.4 (BLADE) ×2 IMPLANT
ELECT REM PT RETURN 9FT ADLT (ELECTROSURGICAL) ×2
ELECTRODE REM PT RTRN 9FT ADLT (ELECTROSURGICAL) ×1 IMPLANT
EVACUATOR 1/8 PVC DRAIN (DRAIN) IMPLANT
FACESHIELD LNG OPTICON STERILE (SAFETY) ×2 IMPLANT
GLOVE BIO SURGEON STRL SZ7 (GLOVE) ×2 IMPLANT
GLOVE BIOGEL PI IND STRL 7.0 (GLOVE) ×1 IMPLANT
GLOVE BIOGEL PI IND STRL 7.5 (GLOVE) ×1 IMPLANT
GLOVE BIOGEL PI INDICATOR 7.0 (GLOVE) ×1
GLOVE BIOGEL PI INDICATOR 7.5 (GLOVE) ×1
GLOVE SS BIOGEL STRL SZ 7.5 (GLOVE) ×1 IMPLANT
GLOVE SUPERSENSE BIOGEL SZ 7.5 (GLOVE) ×1
GOWN PREVENTION PLUS XLARGE (GOWN DISPOSABLE) ×8 IMPLANT
GOWN STRL NON-REIN LRG LVL3 (GOWN DISPOSABLE) IMPLANT
HANDPIECE INTERPULSE COAX TIP (DISPOSABLE) ×1
HOOD PEEL AWAY FACE SHEILD DIS (HOOD) ×4 IMPLANT
IMMOBILIZER KNEE 22 UNIV (SOFTGOODS) ×4 IMPLANT
INSERT CUSHION PRONEVIEW LG (MISCELLANEOUS) ×2 IMPLANT
KIT BASIN OR (CUSTOM PROCEDURE TRAY) ×2 IMPLANT
KIT ROOM TURNOVER OR (KITS) ×2 IMPLANT
MANIFOLD NEPTUNE II (INSTRUMENTS) ×2 IMPLANT
NS IRRIG 1000ML POUR BTL (IV SOLUTION) ×2 IMPLANT
PACK TOTAL JOINT (CUSTOM PROCEDURE TRAY) ×2 IMPLANT
PAD ARMBOARD 7.5X6 YLW CONV (MISCELLANEOUS) ×4 IMPLANT
PAD CAST 4YDX4 CTTN HI CHSV (CAST SUPPLIES) ×1 IMPLANT
PADDING CAST COTTON 4X4 STRL (CAST SUPPLIES) ×1
PADDING CAST COTTON 6X4 STRL (CAST SUPPLIES) ×2 IMPLANT
POSITIONER HEAD PRONE TRACH (MISCELLANEOUS) ×2 IMPLANT
RUBBERBAND STERILE (MISCELLANEOUS) ×2 IMPLANT
SET HNDPC FAN SPRY TIP SCT (DISPOSABLE) ×1 IMPLANT
SPONGE GAUZE 4X4 12PLY (GAUZE/BANDAGES/DRESSINGS) ×2 IMPLANT
STRIP CLOSURE SKIN 1/2X4 (GAUZE/BANDAGES/DRESSINGS) ×2 IMPLANT
SUCTION FRAZIER TIP 10 FR DISP (SUCTIONS) ×2 IMPLANT
SUT MNCRL AB 3-0 PS2 18 (SUTURE) ×2 IMPLANT
SUT VIC AB 0 CT1 27 (SUTURE) ×2
SUT VIC AB 0 CT1 27XBRD ANBCTR (SUTURE) ×2 IMPLANT
SUT VIC AB 2-0 CT1 27 (SUTURE) ×2
SUT VIC AB 2-0 CT1 TAPERPNT 27 (SUTURE) ×2 IMPLANT
SUT VLOC 180 0 24IN GS25 (SUTURE) ×2 IMPLANT
SYR 30ML SLIP (SYRINGE) ×2 IMPLANT
TOWEL OR 17X24 6PK STRL BLUE (TOWEL DISPOSABLE) ×2 IMPLANT
TOWEL OR 17X26 10 PK STRL BLUE (TOWEL DISPOSABLE) ×2 IMPLANT
TRAY FOLEY CATH 14FR (SET/KITS/TRAYS/PACK) ×2 IMPLANT
WATER STERILE IRR 1000ML POUR (IV SOLUTION) ×2 IMPLANT

## 2011-10-20 NOTE — Progress Notes (Signed)
UR COMPLETED  

## 2011-10-20 NOTE — Preoperative (Signed)
Beta Blockers   Reason not to administer Beta Blockers:Not Applicable 

## 2011-10-20 NOTE — Progress Notes (Signed)
Orthopedic Tech Progress Note Patient Details:  Jordan [name] [birth year] [id number] CPM applied to Left LE, OHF and trapeze added to bed CPM Left Knee CPM Left Knee: On Left Knee Flexion (Degrees): 60  Left Knee Extension (Degrees): 0    [name] 10/20/2011, 10:01 AM

## 2011-10-20 NOTE — Interval H&P Note (Signed)
History and Physical Interval Note:  10/20/2011 7:02 AM  Jordan [name]  has presented today for surgery, with the diagnosis of DJD LEFT KNEE  The various methods of treatment have been discussed with the patient and family. After consideration of risks, benefits and other options for treatment, the patient has consented to  Procedure(s) (LRB): TOTAL KNEE ARTHROPLASTY (Left) as a surgical intervention .  The patient's history has been reviewed, patient examined, no change in status, stable for surgery.  I have reviewed the patient's chart and labs.  Questions were answered to the patient's satisfaction.     Salvatore Marvel A

## 2011-10-20 NOTE — Progress Notes (Signed)
PHARMACIST - PHYSICIAN ORDER COMMUNICATION  CONCERNING: P&T Medication Policy on Herbal Medications  DESCRIPTION:  This patient's order for:  Lysine has been noted.  This product(s) is classified as an "herbal" or natural product. Due to a lack of definitive safety studies or FDA approval, nonstandard manufacturing practices, plus the potential risk of unknown drug-drug interactions while on inpatient medications, the Pharmacy and Therapeutics Committee does not permit the use of "herbal" or natural products of this type within [facility]   ACTION TAKEN: The pharmacy department is unable to verify this order at this time and your patient has been informed of this safety policy. Please reevaluate patient's clinical condition at discharge and address if the herbal or natural product(s) should be resumed at that time.  [employer], Pharm.D., BCPS Clinical Pharmacist Pager [phone] 10/20/2011 11:42 AM
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
## 2011-10-20 NOTE — Anesthesia Postprocedure Evaluation (Signed)
  Anesthesia Post-op Note  Patient: Jordan [name]  Procedure(s) Performed: Procedure(s) (LRB): TOTAL KNEE ARTHROPLASTY (Left)  Patient Location: PACU  Anesthesia Type: GA combined with regional for post-op pain  Level of Consciousness: awake, alert  and oriented  Airway and Oxygen Therapy: Patient Spontanous Breathing and Patient connected to nasal cannula oxygen  Post-op Pain: mild  Post-op Assessment: Post-op Vital signs reviewed  Post-op Vital Signs: Reviewed  Complications: No apparent anesthesia complications

## 2011-10-20 NOTE — Anesthesia Procedure Notes (Addendum)
Anesthesia Regional Block:  Femoral nerve block  Pre-Anesthetic Checklist: ,, timeout performed, Correct Patient, Correct Site, Correct Laterality, Correct Procedure,, site marked, risks and benefits discussed, Surgical consent,  Pre-op evaluation,  At surgeon's request and post-op pain management  Laterality: Left  Prep: chloraprep       Needles:  Injection technique: Single-shot  Needle Type: Echogenic Stimulator Needle     Needle Length: 5cm 5 cm Needle Gauge: 22 and 22 G    Additional Needles:  Procedures: ultrasound guided and nerve stimulator Femoral nerve block  Nerve Stimulator or Paresthesia:  Response: quadraceps contraction, 0.45 mA,   Additional Responses:   Narrative:  Start time: 10/20/2011 7:01 AM End time: 10/20/2011 7:11 AM Injection made incrementally with aspirations every 5 mL.  Performed by: Personally  Anesthesiologist: Halford Decamp, MD  Additional Notes: Functioning IV was confirmed and monitors were applied.  A 50mm 22ga Arrow echogenic stimulator needle was used. Sterile prep and drape,hand hygiene and sterile gloves were used. Ultrasound guidance: relevant anatomy identified, needle position confirmed, local anesthetic spread visualized around nerve(s)., vascular puncture avoided.  Image printed for medical record. Negative aspiration and negative test dose prior to incremental administration of local anesthetic. The patient tolerated the procedure well.    Femoral nerve block Procedure Name: LMA Insertion Performed by: Jerilee Hoh Pre-anesthesia Checklist: Patient identified, Emergency Drugs available, Suction available, Patient being monitored and Timeout performed Patient Re-evaluated:Patient Re-evaluated prior to inductionOxygen Delivery Method: Circle system utilized Preoxygenation: Pre-oxygenation with 100% oxygen Intubation Type: IV induction Ventilation: Mask ventilation without difficulty LMA: LMA inserted LMA Size:  3.0 Number of attempts: 1 Dental Injury: Teeth and Oropharynx as per pre-operative assessment

## 2011-10-20 NOTE — Anesthesia Preprocedure Evaluation (Signed)
Anesthesia Evaluation  Patient identified by MRN, date of birth, ID band Patient awake    Reviewed: Allergy & Precautions, H&P , NPO status , Patient's Chart, lab work & pertinent test results, reviewed documented beta blocker date and time   History of Anesthesia Complications (+) PONV  Airway Mallampati: I TM Distance: >3 FB     Dental  (+) Edentulous Upper and Dental Advisory Given   Pulmonary COPD   Pulmonary exam normal       Cardiovascular hypertension, Pt. on medications and Pt. on home beta blockers     Neuro/Psych negative neurological ROS     GI/Hepatic negative GI ROS, Neg liver ROS,   Endo/Other  Type 2, Oral Hypoglycemic AgentsHyperthyroidism   Renal/GU negative Renal ROS     Musculoskeletal   Abdominal   Peds  Hematology negative hematology ROS (+)   Anesthesia Other Findings   Reproductive/Obstetrics                           Anesthesia Physical Anesthesia Plan  ASA: III  Anesthesia Plan: General   Post-op Pain Management: MAC Combined w/ Regional for Post-op pain   Induction: Intravenous  Airway Management Planned: LMA  Additional Equipment:   Intra-op Plan:   Post-operative Plan: Extubation in OR  Informed Consent: I have reviewed the patients History and Physical, chart, labs and discussed the procedure including the risks, benefits and alternatives for the proposed anesthesia with the patient or authorized representative who has indicated his/her understanding and acceptance.   Dental advisory given  Plan Discussed with: CRNA, Anesthesiologist and Surgeon  Anesthesia Plan Comments:         Anesthesia Quick Evaluation

## 2011-10-20 NOTE — Op Note (Signed)
MRN:     [id number] DOB/AGE:    1940/03/17 / 71 y.o.       OPERATIVE REPORT    DATE OF PROCEDURE:  10/20/2011       PREOPERATIVE DIAGNOSIS:   DJD LEFT KNEE      Estimated Body mass index is 20.50 kg/(m^2) as calculated from the following:   Height as of 02/05/11: 5\' 6" (1.676 m).   Weight as of 08/06/11: 127 lb(57.607 kg).                                                        POSTOPERATIVE DIAGNOSIS:   DJD LEFT KNEE, Severe Osteoporosis                                                                      PROCEDURE:  Procedure(s): TOTAL KNEE ARTHROPLASTY Using Depuy Sigma RP implants #3 Femur, #4 revision Tibia, 10mm sigma RP bearing, 35 Patella     SURGEON: Lanyah Spengler A    ASSISTANT:  Kirstin Shepperson PA-C   (Present and scrubbed throughout the case, critical for assistance with exposure, retraction, instrumentation, and closure.)         ANESTHESIA: GET with Femoral Nerve Block  DRAINS: foley, 2 medium hemovac in knee   TOURNIQUET TIME:   COMPLICATIONS:  None     SPECIMENS: None   INDICATIONS FOR PROCEDURE: The patient has  DJD LEFT KNEE, varus deformities, XR shows bone on bone arthritis. Patient has failed all conservative measures including anti-inflammatory medicines, narcotics, attempts at  exercise and weight loss, cortisone injections and viscosupplementation.  Risks and benefits of surgery have been discussed, questions answered.   DESCRIPTION OF PROCEDURE: The patient identified by armband, received  right femoral nerve block and IV antibiotics, in the holding area at [facility]. Patient taken to the operating room, appropriate anesthetic  monitors were attached General endotracheal anesthesia induced with  the patient in supine position, Foley catheter was inserted. Tourniquet  applied high to the operative thigh. Lateral post and foot positioner  applied to the table, the lower extremity was then prepped and draped  in usual sterile fashion from the  ankle to the tourniquet. Time-out procedure was performed. The limb was wrapped with an Esmarch bandage and the tourniquet inflated to 365 mmHg. We began the operation by making the anterior midline incision starting at handbreadth above the patella going over the patella 1 cm medial to and  4 cm distal to the tibial tubercle. Small bleeders in the skin and the  subcutaneous tissue identified and cauterized. Transverse retinaculum was incised and reflected medially and a medial parapatellar arthrotomy was accomplished. the patella was everted and theprepatellar fat pad resected. The superficial medial collateral  ligament was then elevated from anterior to posterior along the proximal  flare of the tibia and anterior half of the menisci resected. The knee was hyperflexed exposing bone on bone arthritis. Peripheral and notch osteophytes as well as the cruciate ligaments were then resected. We continued to  work our way around posteriorly along the proximal tibia, and externally  rotated the tibia subluxing it out from underneath the femur. A McHale  retractor was placed through the notch and a lateral Hohmann retractor  placed, and we then drilled through the proximal tibia in line with the  axis of the tibia followed by an extrameduillary guide  and 2-degree  posterior slope cutting guide. The tibial cutting guide was pinned into place  allowing resection of 4 mm of bone medially and about 7 mm of bone  laterally because of her varus deformity. Satisfied with the tibial resection, we then  entered the distal femur 2 mm anterior to the PCL origin with the  intramedullary guide rod and applied the distal femoral cutting guide  set at 11mm, with 5 degrees of valgus. This was pinned along the  epicondylar axis. At this point, the distal femoral cut was accomplished without difficulty. We then sized for a #3 femoral component and pinned the guide in 3 degrees of external rotation.The chamfer cutting guide  was pinned into place. The anterior, posterior, and chamfer cuts were accomplished without difficulty followed by  the Sigma RP box cutting guide and the box cut. We also removed posterior osteophytes from the posterior femoral condyles. At this  time, the knee was brought into full extension. We checked our  extension and flexion gaps and found them symmetric at 10mm.  The patella thickness measured at 21 mm. We set the cutting guide at 12 and removed the posterior 9.5-10 mm  of the patella sized for 35 button and drilled the lollipop. The knee  was then once again hyperflexed exposing the proximal tibia. We sized for a #4 revision tibial base plate due to her severe osteoporosis, applied the smokestack and the conical reamer followed by the the Delta fin keel punch. We then hammered into place the Sigma RP trial femoral component, inserted a 10-mm trial bearing, trial patellar button, and took the knee through range of motion from 0-130 degrees. No thumb pressure was required for patellar  tracking. At this point, all trial components were removed, a double batch of DePuy HV cement with 1500 mg of Zinacef was mixed and applied to all bony metallic mating surfaces except for the posterior condyles of the femur itself. In order, we  hammered into place the tibial tray and removed excess cement, the femoral component and removed excess cement, a 10-mm Sigma RP bearing  was inserted, and the knee brought to full extension with compression.  The patellar button was clamped into place, and excess cement  removed. While the cement cured the wound was irrigated out with normal saline solution pulse lavage, and medium Hemovac drains were placed.. Ligament stability and patellar tracking were checked and found to be excellent. The tourniquet was then released and hemostasis was obtained with cautery. The parapatellar arthrotomy was closed with  Z lock. The subcutaneous tissue with 0 and 2-0 undyed  Vicryl  suture, and 4-0 Monocryl.. A dressing of Xeroform,  4 x 4, dressing sponges, Webril, and Ace wrap applied. Needle and sponge count were correct times 2.The patient awakened, extubated, and taken to recovery room without difficulty. Vascular status was normal, pulses 2+ and symmetric.   Jordan [name] A 10/20/2011, 8:44 AM

## 2011-10-20 NOTE — Transfer of Care (Signed)
Immediate Anesthesia Transfer of Care Note  Patient: Jordan [name]  Procedure(s) Performed: Procedure(s) (LRB): TOTAL KNEE ARTHROPLASTY (Left)  Patient Location: PACU  Anesthesia Type: General  Level of Consciousness: awake, alert , oriented and patient cooperative  Airway & Oxygen Therapy: Patient Spontanous Breathing and Patient connected to nasal cannula oxygen  Post-op Assessment: Report given to PACU RN, Post -op Vital signs reviewed and stable and Patient moving all extremities  Post vital signs: Reviewed and stable  Complications: No apparent anesthesia complications

## 2011-10-21 ENCOUNTER — Encounter (HOSPITAL_COMMUNITY): Payer: Self-pay | Admitting: General Practice

## 2011-10-21 LAB — BASIC METABOLIC PANEL
BUN: 14 mg/dL (ref 6–23)
CO2: 29 mEq/L (ref 19–32)
Calcium: 9.1 mg/dL (ref 8.4–10.5)
Glucose, Bld: 171 mg/dL — ABNORMAL HIGH (ref 70–99)
Sodium: 136 mEq/L (ref 135–145)

## 2011-10-21 LAB — CBC
MCH: 30.4 pg (ref 26.0–34.0)
MCV: 87.6 fL (ref 78.0–100.0)
Platelets: 150 10*3/uL (ref 150–400)
RDW: 13 % (ref 11.5–15.5)

## 2011-10-21 LAB — HEMOGLOBIN A1C
Hgb A1c MFr Bld: 6.6 % — ABNORMAL HIGH (ref <5.7)
Mean Plasma Glucose: 143 mg/dL — ABNORMAL HIGH (ref <117)

## 2011-10-21 LAB — GLUCOSE, CAPILLARY
Glucose-Capillary: 155 mg/dL — ABNORMAL HIGH (ref 70–99)
Glucose-Capillary: 158 mg/dL — ABNORMAL HIGH (ref 70–99)
Glucose-Capillary: 149 mg/dL — ABNORMAL HIGH (ref 70–99)

## 2011-10-21 MED ORDER — SODIUM CHLORIDE 0.9 % IV BOLUS (SEPSIS)
500.0000 mL | Freq: Once | INTRAVENOUS | Status: AC
  Administered 2011-10-21: 500 mL via INTRAVENOUS

## 2011-10-21 NOTE — Progress Notes (Signed)
Occupational Therapy Evaluation Patient Details Name: Jordan [name] MRN: [id number] DOB: [birth year] Today's Date: 10/21/2011 Time: 1710-1730 OT Time Calculation (min): 20 min  OT Assessment / Plan / Recommendation Clinical Impression  71 yo s/p L TKA. Pt will benefit from skilled Ot services to max independence with ADL and funcitonal mobility for ADL to reurn home. Pt's sister plans to stay with her for a week and then pt will have to be at mod i level.     OT Assessment  Patient needs continued OT Services    Follow Up Recommendations  No OT follow up    Barriers to Discharge None    Equipment Recommendations  None recommended by OT    Recommendations for Other Services    Frequency  Min 2X/week    Precautions / Restrictions Precautions Precautions: Knee Required Braces or Orthoses: Knee Immobilizer - Left Restrictions Weight Bearing Restrictions: Yes Other Position/Activity Restrictions: WBAT   Pertinent Vitals/Pain C/o n/v. nsg notified    ADL  Eating/Feeding: Independent Grooming: Set up Upper Body Bathing: Simulated;Set up Where Assessed - Upper Body Bathing: Unsupported sitting Lower Body Bathing: Simulated;Moderate assistance Where Assessed - Lower Body Bathing: Supported sit to stand Upper Body Dressing: Simulated;Set up Where Assessed - Upper Body Dressing: Unsupported sitting Lower Body Dressing: Simulated;Moderate assistance Where Assessed - Lower Body Dressing: Supported sit to Pharmacist, hospital: Buyer, retail Method: Stand pivot Transfers/Ambulation Related to ADLs: S. limited due to nausea ADL Comments: wILL EDUCATE ON AVAILABLE ae FOR lb adl    OT Diagnosis: Generalized weakness;Acute pain  OT Problem List: Decreased strength;Decreased activity tolerance;Decreased knowledge of use of DME or AE;Pain OT Treatment Interventions: Self-care/ADL training;Energy conservation;DME and/or AE instruction;Therapeutic  activities;Patient/family education   OT Goals Acute Rehab OT Goals OT Goal Formulation: With patient Time For Goal Achievement: 10/28/11 Potential to Achieve Goals: Good ADL Goals Pt Will Perform Lower Body Bathing: with modified independence;Sit to stand from chair;Unsupported;with adaptive equipment ADL Goal: Lower Body Bathing - Progress: Goal set today Pt Will Perform Lower Body Dressing: with modified independence;Sit to stand from chair;Unsupported;with adaptive equipment ADL Goal: Lower Body Dressing - Progress: Goal set today Pt Will Transfer to Toilet: with modified independence;Ambulation;3-in-1 ADL Goal: Toilet Transfer - Progress: Goal set today Pt Will Perform Tub/Shower Transfer: Shower transfer;with supervision;with caregiver independent in assisting;Ambulation;Other (comment) (3 in 1) ADL Goal: Tub/Shower Transfer - Progress: Goal set today  Visit Information  Last OT Received On: 10/21/11 Assistance Needed: +1    Subjective Data      Prior Functioning  Vision/Perception  Home Living Lives With: Spouse Available Help at Discharge: Family;Friend(s) Type of Home: House Home Access: Level entry Home Layout: One level Bathroom Shower/Tub: Health visitor: Standard Bathroom Accessibility: Yes How Accessible: Accessible via walker Home Adaptive Equipment: Walker - rolling;Bedside commode/3-in-1 Prior Function Level of Independence: Independent Able to Take Stairs?: Yes Driving: Yes 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, community: No difficulties Dominant Hand: Right      Cognition  Overall Cognitive Status: Appears within functional limits for tasks assessed/performed Arousal/Alertness: Awake/alert Orientation Level: Appears intact for tasks assessed Behavior During Session: [facility] for tasks performed    Extremity/Trunk Assessment Right Upper Extremity Assessment RUE ROM/Strength/Tone: Within functional  levels Left Upper Extremity Assessment LUE ROM/Strength/Tone: Within functional levels   Mobility Bed Mobility Bed Mobility: Sit to Supine Sit to Supine: 5: Supervision Details for Bed Mobility Assistance: superviison Transfers Sit to Stand: 5: Supervision Stand to Sit: 5: Supervision  Exercise    Balance  WFL  End of Session OT - End of Session Activity Tolerance: Other (comment) (limited by n/v) Patient left: in bed;with call bell/phone within reach;with family/visitor present CPM Left Knee CPM Left Knee: Off Left Knee Flexion (Degrees): 0  Left Knee Extension (Degrees): 60   GO     Shoshanah Dapper,HILLARY 10/21/2011, 5:38 PM [facility], OTR/L  [phone] 10/21/2011

## 2011-10-21 NOTE — Progress Notes (Signed)
Physical Therapy Treatment Patient Details Name: Jordan [name] MRN: [id number] DOB: 11/15/1940 Today's Date: 10/21/2011 Time: 7829-5621 PT Time Calculation (min): 26 min  PT Assessment / Plan / Recommendation Comments on Treatment Session  Pt demonstrates improvement in functional mobility and activity tolerance.  Pt still presents with deficits in overall mobility status secondary to increased pain, decreased ROM and decreased activity tolerance. Pt will continue to benefit from skilled PT to maximize independence for discharge.    Follow Up Recommendations  Home health PT    Barriers to Discharge        Equipment Recommendations  None recommended by PT    Recommendations for Other Services    Frequency 7X/week   Plan Discharge plan remains appropriate    Precautions / Restrictions Precautions Precautions: Knee Required Braces or Orthoses: Knee Immobilizer - Left Restrictions Weight Bearing Restrictions: Yes Other Position/Activity Restrictions: WBAT   Pertinent Vitals/Pain 6/10    Mobility  Bed Mobility Bed Mobility: Sit to Supine Sit to Supine: 5: Supervision Transfers Transfers: Sit to Stand;Stand to Sit Sit to Stand: 5: Supervision Stand to Sit: 5: Supervision Ambulation/Gait Ambulation/Gait Assistance: 5: Supervision Ambulation Distance (Feet): 130 Feet Assistive device: Rolling walker Gait Pattern: Step-through pattern;Decreased stride length;Antalgic Gait velocity: decreased General Gait Details: Pt ambulated initially with step to but was able to convert to step through pattern with VCs      PT Goals Acute Rehab PT Goals PT Goal Formulation: With patient Time For Goal Achievement: 10/28/11 Potential to Achieve Goals: Good Pt will go Sit to Stand: with modified independence PT Goal: Sit to Stand - Progress: Progressing toward goal Pt will Ambulate: >150 feet;with modified independence;with rolling walker PT Goal: Ambulate - Progress: Progressing  toward goal  Visit Information  Last PT Received On: 10/21/11 Assistance Needed: +1    Subjective Data  Subjective: "I am feeling a little sick to my stomach" Patient Stated Goal: to go home   Cognition  Overall Cognitive Status: Appears within functional limits for tasks assessed/performed Arousal/Alertness: Awake/alert Orientation Level: Appears intact for tasks assessed Behavior During Session: [facility] for tasks performed    Balance     End of Session PT - End of Session Equipment Utilized During Treatment: Gait belt;Left knee immobilizer Activity Tolerance: Patient tolerated treatment well;Patient limited by fatigue;Patient limited by pain Patient left: with call bell/phone within reach;in bed;in CPM Nurse Communication: Mobility status CPM Left Knee CPM Left Knee: Off   GP     Fabio Asa 10/21/2011, 2:32 PM Charlotte Crumb, PT DPT  [phone]

## 2011-10-21 NOTE — Progress Notes (Addendum)
Patient ID: Jordan [name], female   DOB: 1940/05/09, 71 y.o.   MRN: [id number] PATIENT ID: Jordan [name]  MRN: [id number]  DOB/AGE:  Oct 05, 1940 / 71 y.o.  1 Day Post-Op Procedure(s) (LRB): TOTAL KNEE ARTHROPLASTY (Left)    PROGRESS NOTE Subjective: Patient is alert, oriented, no Nausea, no Vomiting, yes passing gas, no Bowel Movement. Taking PO well. Denies SOB, Chest or Calf Pain. Using Incentive Spirometer, PAS in place. Ambulate not yet, CPM 0-60 Patient reports pain as 6 on 0-10 scale  .    Objective: Vital signs in last 24 hours: Filed Vitals:   10/20/11 1200 10/20/11 1600 10/20/11 2213 10/21/11 0616  BP:   148/56 151/55  Pulse:   76 85  Temp:   98.5 F (36.9 C) 98.5 F (36.9 C)  TempSrc:      Resp: 18 16 17 17   SpO2: 95% 96% 99% 100%      Intake/Output from previous day: I/O last 3 completed shifts: In: 2200 [I.V.:2200] Out: 1575 [Urine:1400; Drains:125; Blood:50]   Intake/Output this shift:     LABORATORY DATA:  Basename 10/21/11 0620 10/20/11 1639 10/20/11 1142 10/20/11 0921  WBC 6.3 -- -- --  HGB 9.3* -- -- --  HCT 26.8* -- -- --  PLT 150 -- -- --  NA 136 -- -- --  K 4.5 -- -- --  CL 103 -- -- --  CO2 29 -- -- --  BUN 14 -- -- --  CREATININE 0.64 -- -- --  GLUCOSE 171* -- -- --  GLUCAP -- 125* 138* 180*  INR -- -- -- --  CALCIUM 9.1 -- -- --    Examination: Neurologically intact ABD soft Sensation intact distally Intact pulses distally Dorsiflexion/Plantar flexion intact Incision: dressing C/D/I} Significant blistering up and down her whole leg.  Serous fluid in all blisters Leg dressing saturated.    Assessment:   1 Day Post-Op Procedure(s) (LRB): TOTAL KNEE ARTHROPLASTY (Left) ADDITIONAL DIAGNOSIS:  Acute Blood Loss Anemia Perioperative blistering due to lower extremity Plan: PT/OT WBAT, CPM 5/hrs day until ROM 0-90 degrees, then D/C CPM DVT Prophylaxis:  SCDx72hrs, ASA 325 mg BID x 2 weeks DISCHARGE PLAN: Home DISCHARGE  NEEDS: HHPT Fluid bolus times 2, new dressing applied.  mepelex boarder applied due to significant blistering, saline lock after 2nd fluid bolus     Jordan [name] J 10/21/2011, 8:14 AM

## 2011-10-21 NOTE — Evaluation (Signed)
Physical Therapy Evaluation Patient Details Name: Jordan [name] MRN: [id number] DOB: [birth year] Today's Date: 10/21/2011 Time: 1308-6578 PT Time Calculation (min): 41 min  PT Assessment / Plan / Recommendation Clinical Impression  Pt is a 71 y.o. female s/p left TKA.  Pt demonstrates deficits in functional mobility secondary to increased pain, decreased ROM and decreased activity tolerance.  Pt will continue to benefit from skilled PT to improve strength, ROM, and overall functional performance. Will continue to see to maximize independence for d/c.    PT Assessment  Patient needs continued PT services    Follow Up Recommendations  Home health PT    Barriers to Discharge None      Equipment Recommendations  None recommended by PT    Recommendations for Other Services     Frequency 7X/week    Precautions / Restrictions Precautions Precautions: Knee Required Braces or Orthoses: Knee Immobilizer - Left Restrictions Weight Bearing Restrictions: Yes Other Position/Activity Restrictions: WBAT   Pertinent Vitals/Pain 4/10     Mobility  Bed Mobility Bed Mobility: Supine to Sit;Sitting - Scoot to Edge of Bed Supine to Sit: 5: Supervision Sitting - Scoot to Edge of Bed: 5: Supervision Transfers Transfers: Sit to Stand;Stand to Sit Sit to Stand: 4: Min guard;From bed Stand to Sit: 4: Min guard;To chair/3-in-1;With armrests Details for Transfer Assistance: Vcs provided initially for hand placement Ambulation/Gait Ambulation/Gait Assistance: 4: Min guard Ambulation Distance (Feet): 20 Feet Assistive device: Rolling walker Gait Pattern: Step-to pattern;Decreased stride length;Antalgic Gait velocity: decreased General Gait Details: Pt ambulated 10 ft around bed and became mildly lightheaded and diaphoretic. Symptoms resolved with seated break on EOB, Pt then ambulated back to other side of room to chair.  pt complains of minimal dizziness post ambulation.  SpO2 96% on rm air  at seated break. Pt cued for pursed lip breathing to return SpO2 to baseline.  Stairs: No    Exercises Total Joint Exercises Ankle Circles/Pumps: AROM;Both;10 reps Quad Sets: AROM;Strengthening;10 reps;Left Straight Leg Raises: AROM;Strengthening;10 reps;Left   PT Diagnosis: Difficulty walking;Abnormality of gait;Generalized weakness;Acute pain  PT Problem List: Decreased strength;Decreased range of motion;Decreased activity tolerance;Decreased mobility;Pain PT Treatment Interventions: DME instruction;Gait training;Stair training;Functional mobility training;Therapeutic activities;Therapeutic exercise;Patient/family education   PT Goals Acute Rehab PT Goals PT Goal Formulation: With patient Time For Goal Achievement: 10/28/11 Potential to Achieve Goals: Good Pt will go Sit to Stand: with modified independence PT Goal: Sit to Stand - Progress: Goal set today Pt will Ambulate: >150 feet;with modified independence;with rolling walker PT Goal: Ambulate - Progress: Goal set today Pt will Go Up / Down Stairs: 1-2 stairs;with modified independence;with rolling walker PT Goal: Up/Down Stairs - Progress: Goal set today Pt will Perform Home Exercise Program: Independently PT Goal: Perform Home Exercise Program - Progress: Goal set today  Visit Information  Last PT Received On: 10/21/11 Assistance Needed: +1    Subjective Data  Subjective: "The pain is not too bad" Patient Stated Goal: to go home   Prior Functioning  Home Living Lives With: Spouse Available Help at Discharge: Family;Friend(s) Type of Home: House Home Access: Level entry Home Layout: One level Bathroom Shower/Tub: Engineer, manufacturing systems: Standard Prior Function Level of Independence: Independent Able to Take Stairs?: Yes Driving: Yes Vocation: Part time employment Comments: Conservation officer, nature at [employer] Communication: No difficulties    Cognition  Overall Cognitive Status: Appears within  functional limits for tasks assessed/performed Arousal/Alertness: Awake/alert Orientation Level: Appears intact for tasks assessed Behavior During Session: [facility] for tasks performed  Extremity/Trunk Assessment Right Upper Extremity Assessment RUE ROM/Strength/Tone: [facility] for tasks assessed Left Upper Extremity Assessment LUE ROM/Strength/Tone: WFL for tasks assessed Right Lower Extremity Assessment RLE ROM/Strength/Tone: WFL for tasks assessed Left Lower Extremity Assessment LLE ROM/Strength/Tone: Deficits;Due to pain;Due to precautions      End of Session PT - End of Session Equipment Utilized During Treatment: Gait belt;Left knee immobilizer Activity Tolerance: Patient tolerated treatment well;Patient limited by fatigue;Patient limited by pain Patient left: in chair;with call bell/phone within reach Nurse Communication: Mobility status CPM Left Knee CPM Left Knee: Off  GP     Fabio Asa 10/21/2011, 9:12 AM Charlotte Crumb, PT DPT  [phone]

## 2011-10-22 LAB — CBC
Hemoglobin: 7.8 g/dL — ABNORMAL LOW (ref 12.0–15.0)
MCHC: 35.8 g/dL (ref 30.0–36.0)
RDW: 13.2 % (ref 11.5–15.5)

## 2011-10-22 LAB — GLUCOSE, CAPILLARY
Glucose-Capillary: 177 mg/dL — ABNORMAL HIGH (ref 70–99)
Glucose-Capillary: 145 mg/dL — ABNORMAL HIGH (ref 70–99)

## 2011-10-22 LAB — BASIC METABOLIC PANEL
GFR calc Af Amer: >90 mL/min (ref >90)
GFR calc non Af Amer: 88 mL/min — ABNORMAL LOW (ref >90)
Potassium: 4.7 mEq/L (ref 3.5–5.1)
Sodium: 136 mEq/L (ref 135–145)

## 2011-10-22 LAB — PREPARE RBC (CROSSMATCH)

## 2011-10-22 MED ORDER — BISACODYL 10 MG RE SUPP: 10.0000 mg | Freq: Once | RECTAL | Status: DC

## 2011-10-22 MED ORDER — DSS 100 MG PO CAPS: 100.0000 mg | ORAL_CAPSULE | Freq: Two times a day (BID) | ORAL | Status: AC

## 2011-10-22 MED ORDER — BISACODYL 5 MG PO TBEC: 10.0000 mg | DELAYED_RELEASE_TABLET | Freq: Every day | ORAL | Status: AC

## 2011-10-22 MED ORDER — ENOXAPARIN SODIUM 30 MG/0.3ML ~~LOC~~ SOLN: 30.0000 mg | Freq: Two times a day (BID) | SUBCUTANEOUS | Status: DC

## 2011-10-22 MED ORDER — OXYCODONE HCL 5 MG PO TABS: ORAL_TABLET | ORAL | Status: DC

## 2011-10-22 MED ORDER — FUROSEMIDE 10 MG/ML IJ SOLN
20.0000 mg | Freq: Once | INTRAMUSCULAR | Status: AC
  Administered 2011-10-22: 20 mg via INTRAVENOUS
  Filled 2011-10-22: qty 2

## 2011-10-22 NOTE — Progress Notes (Signed)
OT PROGRESS NOTE   10/22/11 1700  OT Visit Information  Last OT Received On 10/22/11  Assistance Needed +1  OT Time Calculation  OT Start Time 1210  OT Stop Time 1230  OT Time Calculation (min) 20 min  Precautions  Required Braces or Orthoses Knee Immobilizer - Left  ADL  ADL Comments Focus of sessino on teaching pt/husband about use of AE for ADL. Pt and husband verbalized understadning. Demonsstrated with husband how to complete shower transfer.   Cognition  Overall Cognitive Status Appears within functional limits for tasks assessed/performed  Arousal/Alertness Awake/alert  Orientation Level Appears intact for tasks assessed  Behavior During Session [facility] for tasks performed  OT Assessment/Plan  Comments on Treatment Session Excellent progress. Ready for D/C.  OT Plan Discharge plan remains appropriate  Follow Up Recommendations No OT follow up  Equipment Recommended None recommended by OT  Acute Rehab OT Goals  OT Goal Formulation With patient  Time For Goal Achievement 10/28/11  Potential to Achieve Goals Good  ADL Goals  Pt Will Perform Lower Body Bathing with modified independence;Sit to stand from chair;Unsupported;with adaptive equipment  ADL Goal: Lower Body Bathing - Progress Met  Pt Will Perform Lower Body Dressing with modified independence;Sit to stand from chair;Unsupported;with adaptive equipment  ADL Goal: Lower Body Dressing - Progress Met  Pt Will Transfer to Toilet with modified independence;Ambulation;3-in-1  ADL Goal: Toilet Transfer - Progress Met  Pt Will Perform Tub/Shower Transfer Shower transfer;with supervision;with caregiver independent in assisting;Ambulation;Other (comment)  ADL Goal: Tub/Shower Transfer - Progress Progressing toward goals  OT General Charges  $OT Visit 1 Procedure  OT Treatments  $Self Care/Home Management  8-22 mins    [facility], OTR/L  [phone] 10/22/2011

## 2011-10-22 NOTE — Progress Notes (Signed)
Patient ID: ALEEA HENDRY, female   DOB: [birth year], 71 y.o.   MRN: [id number] PATIENT ID: KAMLA SKILTON  MRN: [id number]  DOB/AGE:  September 05, 1940 / 71 y.o.  2 Days Post-Op Procedure(s) (LRB): TOTAL KNEE ARTHROPLASTY (Left)    PROGRESS NOTE Subjective: Patient is alert, oriented, no Nausea, no Vomiting, yes passing gas, no Bowel Movement. Taking PO well. Denies SOB, Chest or Calf Pain. Using Incentive Spirometer, PAS in place. Ambulate well, CPM 0-60 Patient reports pain as 3 on 0-10 scale and 4 on 0-10 scale  .    Objective: Vital signs in last 24 hours: Filed Vitals:   10/21/11 0616 10/21/11 1323 10/21/11 2000 10/22/11 0639  BP: 151/55 113/45 129/51 130/37  Pulse: 85 62 88 86  Temp: 98.5 F (36.9 C) 98.5 F (36.9 C) 98.4 F (36.9 C) 97.4 F (36.3 C)  TempSrc:  Oral Oral Oral  Resp: 17 18 16 16   SpO2: 100% 100% 98% 92%      Intake/Output from previous day: I/O last 3 completed shifts: In: 1420 [P.O.:120; I.V.:1200; Other:100] Out: 1525 [Urine:1400; Drains:125]   Intake/Output this shift:     LABORATORY DATA:  Basename 10/22/11 0711 10/22/11 0627 10/21/11 2136 10/21/11 1643 10/21/11 0620  WBC -- 8.1 -- -- 6.3  HGB -- 7.8* -- -- 9.3*  HCT -- 21.8* -- -- 26.8*  PLT -- 127* -- -- 150  NA -- 136 -- -- 136  K -- 4.7 -- -- 4.5  CL -- 102 -- -- 103  CO2 -- 30 -- -- 29  BUN -- 15 -- -- 14  CREATININE -- 0.64 -- -- 0.64  GLUCOSE -- 203* -- -- 171*  GLUCAP 177* -- 183* 149* --  INR -- -- -- -- --  CALCIUM -- 9.8 -- -- --    Examination: Neurologically intact ABD soft Neurovascular intact Sensation intact distally Intact pulses distally Dorsiflexion/Plantar flexion intact Incision: surgical wound well approximated. small bloody drainage distally.  3+ blistering about the left knee}  Assessment:   2 Days Post-Op Procedure(s) (LRB): TOTAL KNEE ARTHROPLASTY (Left) ADDITIONAL DIAGNOSIS:  Acute Blood Loss Anemia with hypotension Patient Active Problem List    Diagnosis  . DIABETES-TYPE 2  . HYPERLIPIDEMIA  . TOBACCO USE  . HYPERTENSION  . CLAUDICATION  . COPD  . OSTEOPENIA  . CAROTID BRUIT, RIGHT  . COPD (chronic obstructive pulmonary disease)  . PVD (peripheral vascular disease)  . Carotid arterial disease  . Hyperlipidemia  . Thyroid nodule  . Abnormal thyroid blood test  . Hypercalcemia  . Visit for preventive health examination  . Medicare annual wellness visit, subsequent  . Emotional lability  . Weight loss  . Hyperthyroidism  . Diabetes mellitus with peripheral vascular disease  . Degenerative arthritis of left knee  . Pre-operative clearance  . S/P lumbar laminectomy  . Left knee DJD   Plan: PT/OT WBAT, CPM 5/hrs day until ROM 0-90 degrees DVT Prophylaxis:  SCDx72hrs, lovenox DISCHARGE PLAN: Home DISCHARGE NEEDS: HHPT and HHRN Type cross and transfuse 2 units PRBCs with 20 mg lasix IV between units    Shakthi Scipio J 10/22/2011, 7:55 AM

## 2011-10-22 NOTE — Progress Notes (Signed)
Physical Therapy Treatment Patient Details Name: Jordan [name] MRN: [id number] DOB: [birth year] Today's Date: 10/22/2011 Time: 4098-1191 PT Time Calculation (min): 37 min  PT Assessment / Plan / Recommendation Comments on Treatment Session  Patient progressing well with ambulation and stairs. Patient recieving blood prior to DC. Patient has reviewed all education and mobility training to DC home safely with family support. If patient still here this afternoon, may come and review HEP once more prior to DC.     Follow Up Recommendations  Home health PT    Barriers to Discharge        Equipment Recommendations  None recommended by PT    Recommendations for Other Services    Frequency 7X/week   Plan Discharge plan remains appropriate;Frequency remains appropriate    Precautions / Restrictions Precautions Precautions: None Restrictions Weight Bearing Restrictions: Yes LLE Weight Bearing: Weight bearing as tolerated   Pertinent Vitals/Pain     Mobility  Bed Mobility Supine to Sit: 6: Modified independent (Device/Increase time) Sitting - Scoot to Edge of Bed: 6: Modified independent (Device/Increase time) Sit to Supine: 6: Modified independent (Device/Increase time) Transfers Sit to Stand: 6: Modified independent (Device/Increase time) Stand to Sit: 6: Modified independent (Device/Increase time) Ambulation/Gait Ambulation/Gait Assistance: 5: Supervision Ambulation Distance (Feet): 250 Feet Assistive device: Rolling walker Ambulation/Gait Assistance Details: Cues for step through gait pattern. Supervision for safety. First ambulation without KI, no evidence of buckling Gait Pattern: Step-through pattern Stairs: Yes Stairs Assistance: 5: Supervision Stairs Assistance Details (indicate cue type and reason): cues for technique Stair Management Technique: Step to pattern;Forwards;With walker Number of Stairs: 1     Exercises Total Joint Exercises Long Arc Quad:  AAROM;Left;10 reps;Seated   PT Diagnosis:    PT Problem List:   PT Treatment Interventions:     PT Goals Acute Rehab PT Goals PT Goal: Sit to Stand - Progress: Met PT Goal: Ambulate - Progress: Progressing toward goal PT Goal: Up/Down Stairs - Progress: Progressing toward goal PT Goal: Perform Home Exercise Program - Progress: Progressing toward goal  Visit Information  Last PT Received On: 10/22/11 Assistance Needed: +1    Subjective Data      Cognition  Overall Cognitive Status: Appears within functional limits for tasks assessed/performed Arousal/Alertness: Awake/alert Orientation Level: Appears intact for tasks assessed Behavior During Session: [facility] for tasks performed    Balance     End of Session PT - End of Session Equipment Utilized During Treatment: Gait belt Activity Tolerance: Patient tolerated treatment well Patient left: in bed;in CPM;with call bell/phone within reach Nurse Communication: Mobility status CPM Left Knee CPM Left Knee: On Left Knee Flexion (Degrees): 60  Left Knee Extension (Degrees): 0    GP     Robinette, Adline Potter 10/22/2011, 8:40 AM 10/22/2011 Fredrich Birks PTA [phone] pager [phone] office

## 2011-10-22 NOTE — Progress Notes (Deleted)
CARE MANAGEMENT NOTE 10/22/2011  Patient:  Jordan [name]   Account Number:  [id number]  Date Initiated:  10/21/2011  Documentation initiated by:  STANFILL,BERTHA  Subjective/Objective Assessment:   Right THA  Widow  Xarelto  PT/OT eval pending     Action/Plan:   CM spoke with patient and children, states she will go to [facility] for shortterm rehab. Cm notified Donnoa Crowder, Social Worker.   Anticipated DC Date:  10/24/2011   Anticipated DC Plan:  SKILLED NURSING FACILITY  In-house referral  Clinical Social Worker      DC Planning Services  CM consult      Choice offered to / List presented to:             Status of service:  Completed, signed off Medicare Important Message given?   (If response is "NO", the following Medicare IM given date fields will be blank) Date Medicare IM given:   Date Additional Medicare IM given:    Discharge Disposition:  SKILLED NURSING FACILITY  Per UR Regulation:  Reviewed for med. necessity/level of care/duration of stay  If discussed at Long Length of Stay Meetings, dates discussed:    Comments:  10/21/11  16:11 Bertha Stanfill RN/CM Level 3, Xarelto approved, no prior auth required, copay is $3.50,    
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
## 2011-10-23 LAB — TYPE AND SCREEN
Antibody Screen: POSITIVE
DAT, IgG: NEGATIVE
Donor AG Type: NEGATIVE
PT AG Type: NEGATIVE
Unit division: 0

## 2011-11-16 ENCOUNTER — Other Ambulatory Visit: Payer: Self-pay | Admitting: Internal Medicine

## 2012-01-02 ENCOUNTER — Encounter: Payer: Self-pay | Admitting: Internal Medicine

## 2012-01-08 ENCOUNTER — Encounter: Payer: Self-pay | Admitting: Internal Medicine

## 2012-01-13 ENCOUNTER — Encounter: Payer: Self-pay | Admitting: Internal Medicine

## 2012-01-13 IMAGING — US US SOFT TISSUE HEAD/NECK
1 series · 14 of 25 positions shown · non-contrast
Comparison: None

CLINICAL DATA: Possible left thyroid nodule on exam.

THYROID ULTRASOUND
TECHNIQUE: Ultrasound examination of the thyroid gland and adjacent
soft tissues was performed.

[Series 1: us soft tissue head/neck · 0.04mm/px · 14 of 62 slices shown]
[im 1/62]
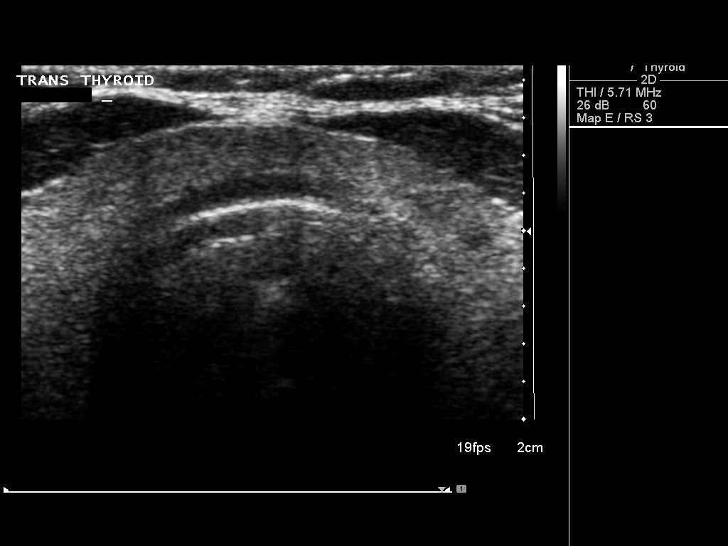
[im 6/62]
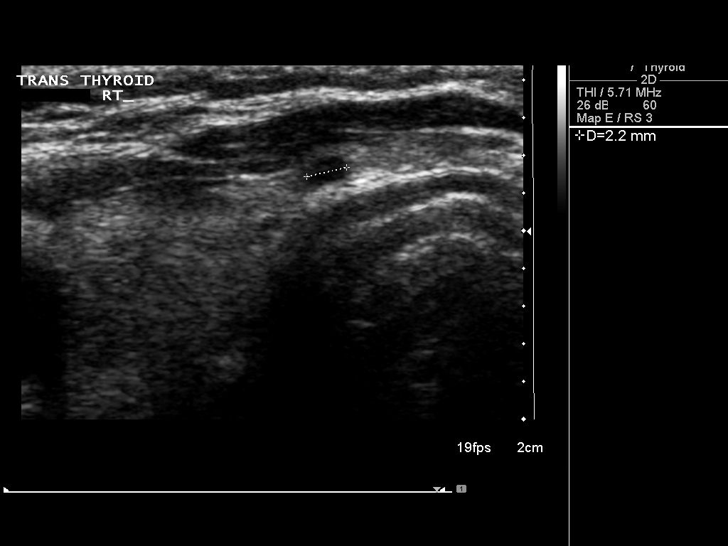
[im 11/62]
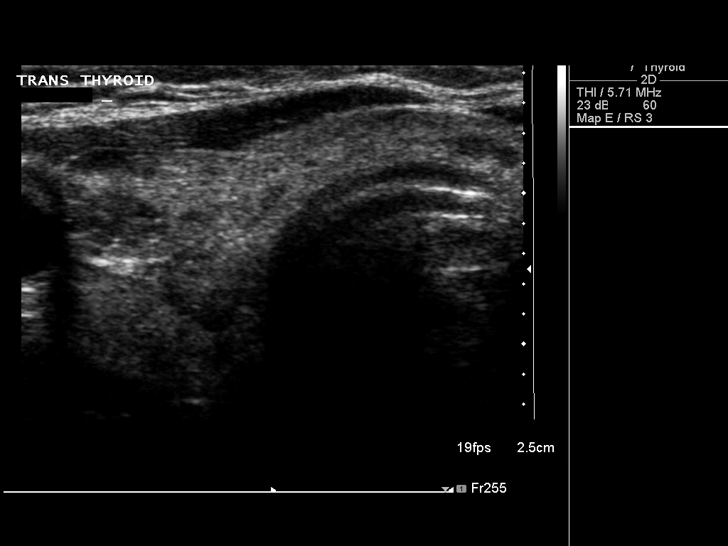
[im 16/62]
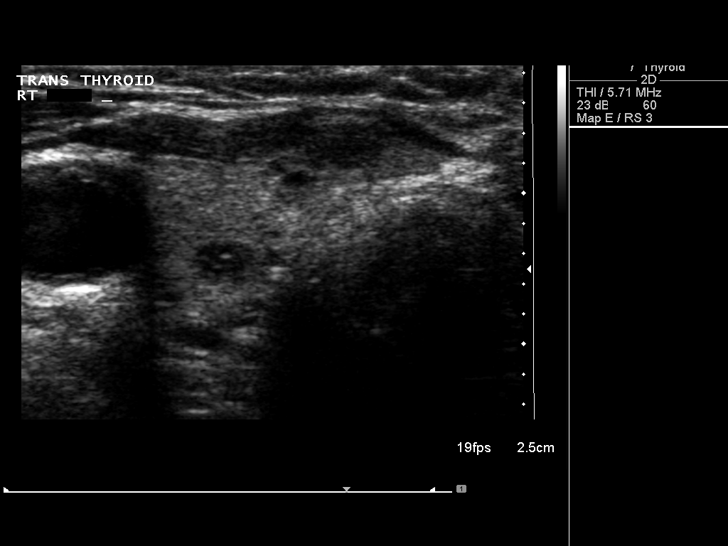
[im 21/62]
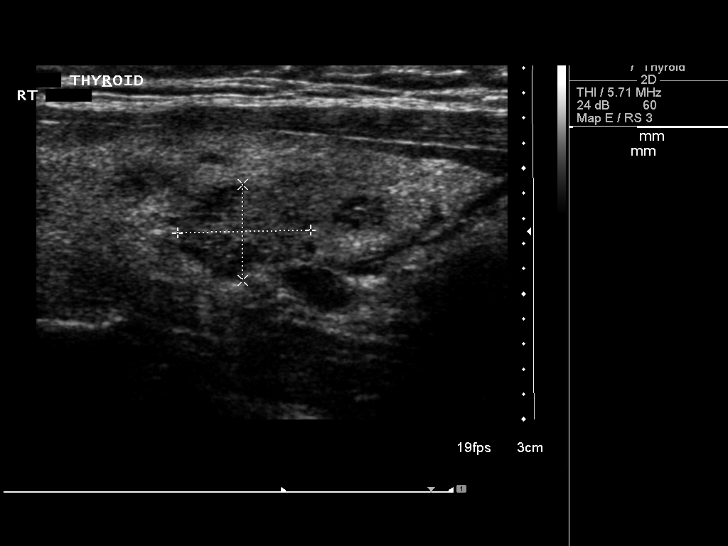
[im 23/62]
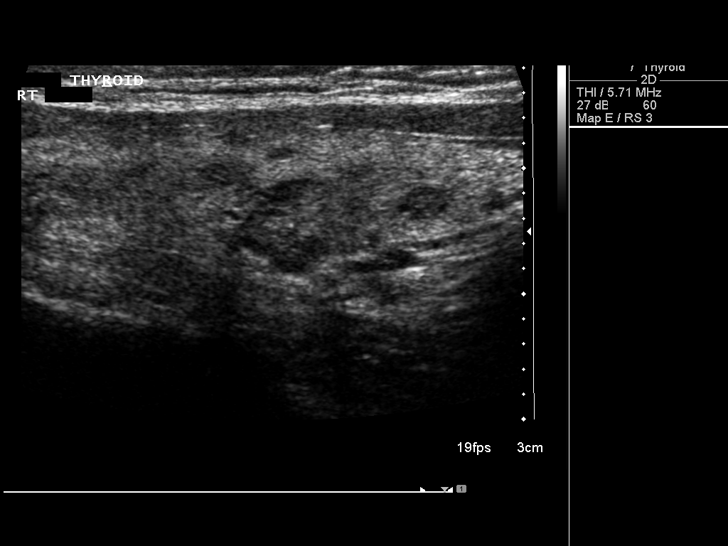
[im 28/62]
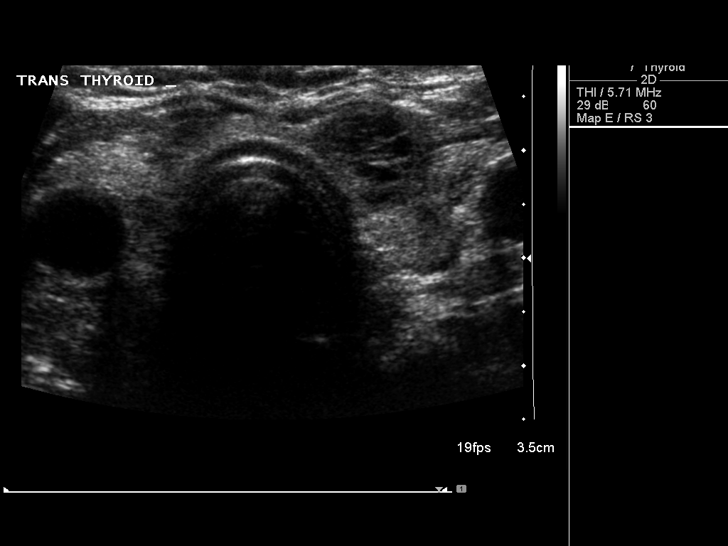
[im 34/62]
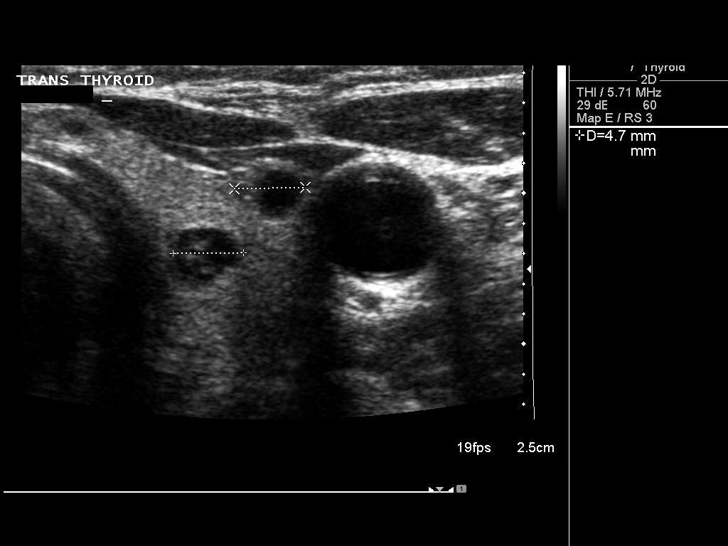
[im 39/62]
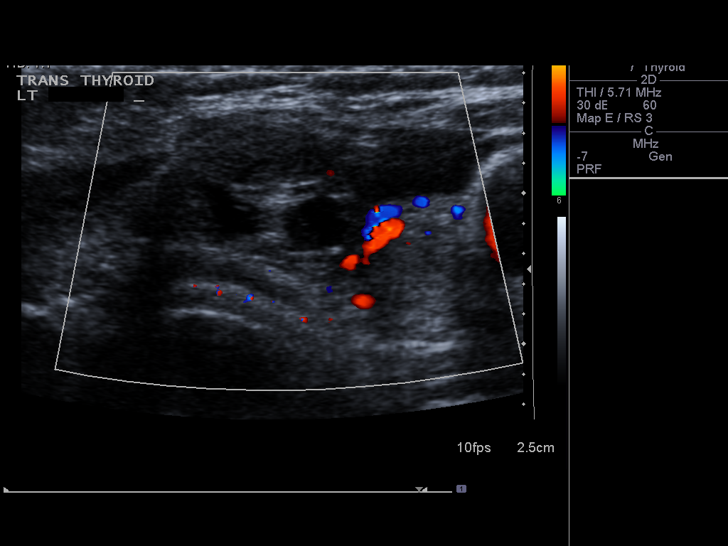
[im 41/62]
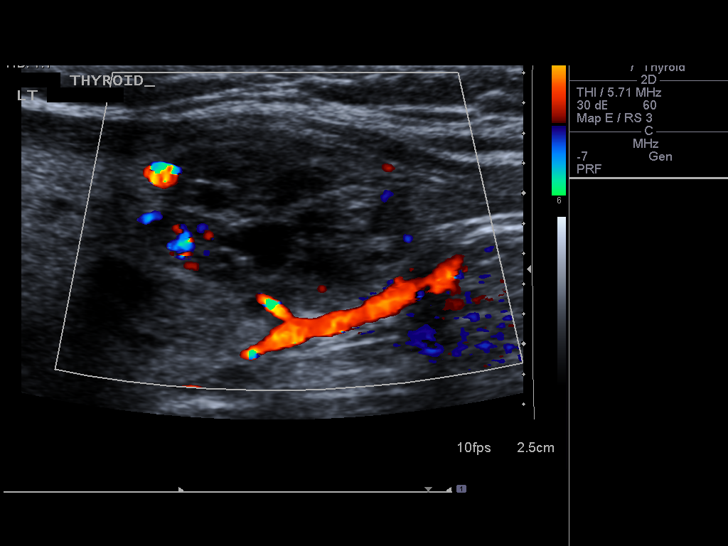
[im 46/62]
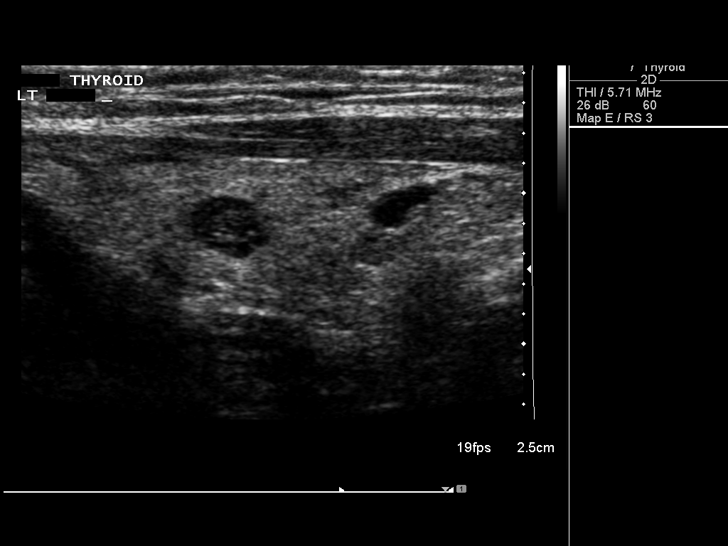
[im 51/62]
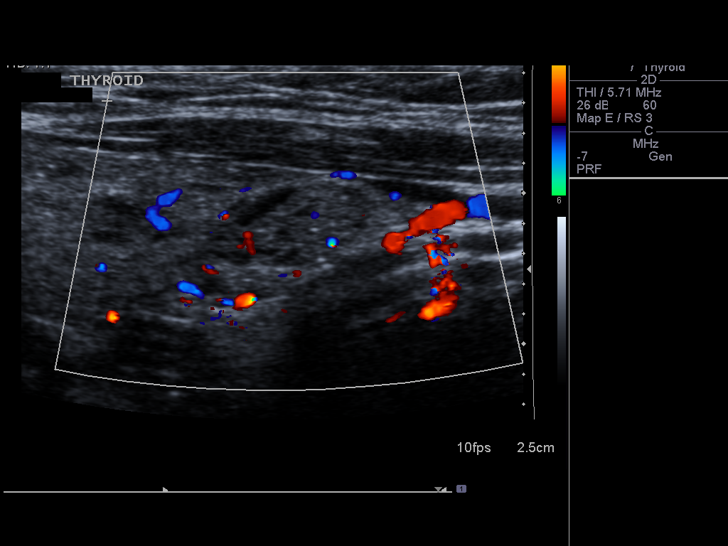
[im 56/62]
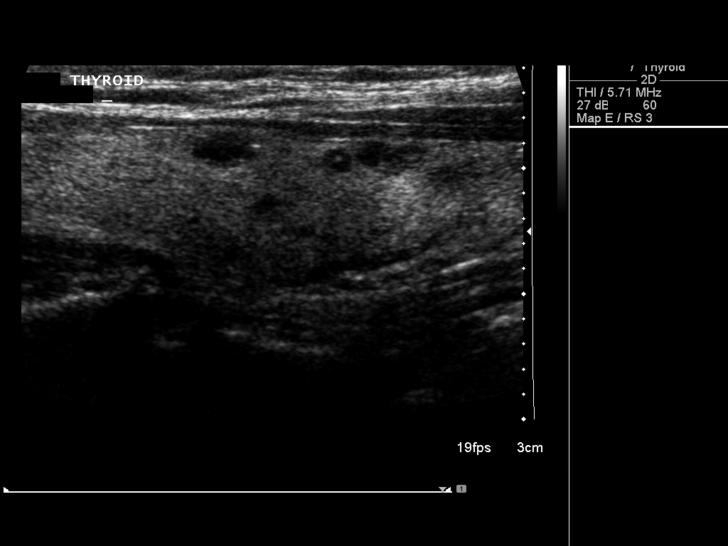
[im 62/62]
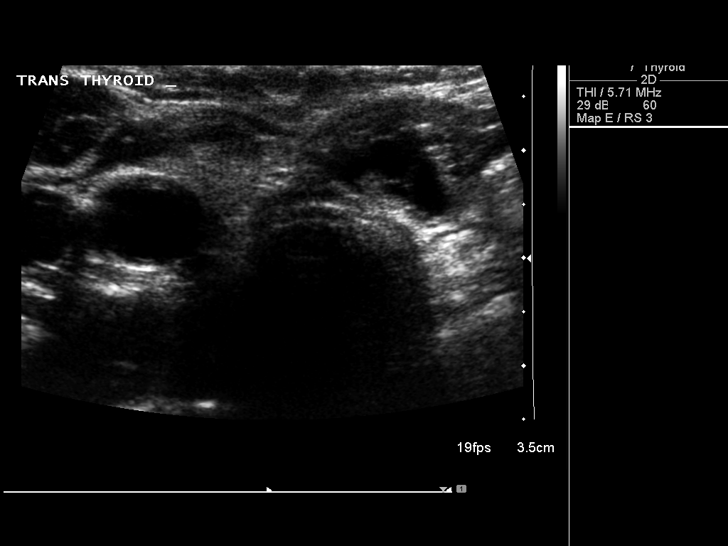

[14 of 25 positions shown; findings below may reference images not displayed]

FINDINGS: Right thyroid lobe:  4.6 x 1.6 x 1.6 cm.
Left thyroid lobe:  4.5 x 1.6 x 1.2 cm.
Isthmus:  4 mm.

Focal nodules:  There are numerous small bilateral thyroid nodules.
The largest is a complex solid cystic nodule in the medial left
thyroid lobe which is at the junction with the isthmus, measuring
1.9 x 1.6 x 1.3 cm.  11 x 6 x 6 mm left lower pole nodule seen with
small scattered microcalcifications.  On the right, the largest
nodule is a medial mid pole nodule measuring 11 x 10 x 8 mm.

Lymphadenopathy:  None visualized.
IMPRESSION: Small bilateral thyroid nodules.  The largest is 1.9 cm in the
medial left thyroid mid pole region near the isthmus. Consider
tissue sampling of this nodule versus follow-up ultrasound in 6-12
months.

## 2012-01-28 ENCOUNTER — Ambulatory Visit: Payer: Medicare Other | Admitting: Vascular Surgery

## 2012-01-28 ENCOUNTER — Other Ambulatory Visit: Payer: Medicare Other

## 2012-01-30 ENCOUNTER — Other Ambulatory Visit (INDEPENDENT_AMBULATORY_CARE_PROVIDER_SITE_OTHER): Payer: Medicare Other

## 2012-01-30 DIAGNOSIS — I10 Essential (primary) hypertension: Secondary | ICD-10-CM

## 2012-01-30 DIAGNOSIS — E785 Hyperlipidemia, unspecified: Secondary | ICD-10-CM

## 2012-01-30 DIAGNOSIS — E119 Type 2 diabetes mellitus without complications: Secondary | ICD-10-CM

## 2012-01-30 LAB — BASIC METABOLIC PANEL
BUN: 24 mg/dL — ABNORMAL HIGH (ref 6–23)
CO2: 34 mEq/L — ABNORMAL HIGH (ref 19–32)
Chloride: 101 mEq/L (ref 96–112)
Creatinine, Ser: 0.7 mg/dL (ref 0.4–1.2)
Potassium: 3.8 mEq/L (ref 3.5–5.1)

## 2012-01-30 LAB — HEPATIC FUNCTION PANEL
ALT: 18 U/L (ref 0–35)
AST: 17 U/L (ref 0–37)
Albumin: 3.9 g/dL (ref 3.5–5.2)
Alkaline Phosphatase: 74 U/L (ref 39–117)
Total Bilirubin: 0.8 mg/dL (ref 0.3–1.2)

## 2012-01-30 LAB — LIPID PANEL
HDL: 60.8 mg/dL (ref >39.00)
Triglycerides: 95 mg/dL (ref 0.0–149.0)

## 2012-02-03 ENCOUNTER — Encounter: Payer: Self-pay | Admitting: Neurosurgery

## 2012-02-03 ENCOUNTER — Encounter: Payer: Self-pay | Admitting: Internal Medicine

## 2012-02-03 ENCOUNTER — Ambulatory Visit (INDEPENDENT_AMBULATORY_CARE_PROVIDER_SITE_OTHER): Payer: Medicare Other | Admitting: Internal Medicine

## 2012-02-03 VITALS — BP 176/74 | HR 64 | Temp 98.0°F | Wt 128.0 lb

## 2012-02-03 DIAGNOSIS — R937 Abnormal findings on diagnostic imaging of other parts of musculoskeletal system: Secondary | ICD-10-CM

## 2012-02-03 DIAGNOSIS — Z9889 Other specified postprocedural states: Secondary | ICD-10-CM

## 2012-02-03 DIAGNOSIS — M79605 Pain in left leg: Secondary | ICD-10-CM

## 2012-02-03 DIAGNOSIS — E785 Hyperlipidemia, unspecified: Secondary | ICD-10-CM

## 2012-02-03 DIAGNOSIS — M949 Disorder of cartilage, unspecified: Secondary | ICD-10-CM

## 2012-02-03 DIAGNOSIS — E1159 Type 2 diabetes mellitus with other circulatory complications: Secondary | ICD-10-CM

## 2012-02-03 DIAGNOSIS — M8580 Other specified disorders of bone density and structure, unspecified site: Secondary | ICD-10-CM

## 2012-02-03 DIAGNOSIS — E0590 Thyrotoxicosis, unspecified without thyrotoxic crisis or storm: Secondary | ICD-10-CM

## 2012-02-03 DIAGNOSIS — M79609 Pain in unspecified limb: Secondary | ICD-10-CM

## 2012-02-03 DIAGNOSIS — E1151 Type 2 diabetes mellitus with diabetic peripheral angiopathy without gangrene: Secondary | ICD-10-CM

## 2012-02-03 DIAGNOSIS — R948 Abnormal results of function studies of other organs and systems: Secondary | ICD-10-CM

## 2012-02-03 DIAGNOSIS — I10 Essential (primary) hypertension: Secondary | ICD-10-CM

## 2012-02-03 DIAGNOSIS — I798 Other disorders of arteries, arterioles and capillaries in diseases classified elsewhere: Secondary | ICD-10-CM

## 2012-02-03 DIAGNOSIS — F17200 Nicotine dependence, unspecified, uncomplicated: Secondary | ICD-10-CM

## 2012-02-03 MED ORDER — LISINOPRIL-HYDROCHLOROTHIAZIDE 20-12.5 MG PO TABS: 2.0000 | ORAL_TABLET | Freq: Every day | ORAL | Status: DC

## 2012-02-03 MED ORDER — ROSUVASTATIN CALCIUM 10 MG PO TABS: 5.0000 mg | ORAL_TABLET | Freq: Every day | ORAL | Status: DC
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
## 2012-02-03 NOTE — Patient Instructions (Signed)
Your blood pressure is elevated today. Because it has been normal in the past we will not adjust her medicine until we get more information. Please check your blood pressure readings at least 3-5 times a week. If they are not controlled below 140/90.  Can contact our office for further advice. We may adjust her medication.  Stopping smoking is the best thing besides what you're doing to reduce risk of progressing vascular disease. Heart attack and stroke. At some point he should have a bone density scan because of your risk of osteoporosis and the fact that her mom has had a hip fracture.  Wellness visit in 6 months or as needed. We can recheck lab work at that time.

## 2012-02-04 ENCOUNTER — Ambulatory Visit (INDEPENDENT_AMBULATORY_CARE_PROVIDER_SITE_OTHER): Payer: Medicare Other | Admitting: *Deleted

## 2012-02-04 ENCOUNTER — Encounter: Payer: Self-pay | Admitting: Neurosurgery

## 2012-02-04 ENCOUNTER — Ambulatory Visit (INDEPENDENT_AMBULATORY_CARE_PROVIDER_SITE_OTHER): Payer: Medicare Other | Admitting: Neurosurgery

## 2012-02-04 VITALS — BP 150/60 | HR 63 | Ht 66.0 in | Wt 129.2 lb

## 2012-02-04 DIAGNOSIS — I6529 Occlusion and stenosis of unspecified carotid artery: Secondary | ICD-10-CM

## 2012-02-04 NOTE — Addendum Note (Signed)
Addended by: Sharee Pimple on: 02/04/2012 02:32 PM   Modules accepted: Orders
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
## 2012-02-06 ENCOUNTER — Ambulatory Visit: Payer: Medicare Other | Admitting: Internal Medicine

## 2012-02-06 DIAGNOSIS — M79605 Pain in left leg: Secondary | ICD-10-CM | POA: Insufficient documentation

## 2012-02-06 DIAGNOSIS — M8580 Other specified disorders of bone density and structure, unspecified site: Secondary | ICD-10-CM | POA: Insufficient documentation

## 2012-02-06 NOTE — Assessment & Plan Note (Signed)
Lab Results  Component Value Date   HGBA1C 6.6* 01/30/2012

## 2012-02-08 ENCOUNTER — Encounter: Payer: Self-pay | Admitting: Internal Medicine

## 2012-02-09 ENCOUNTER — Telehealth: Payer: Self-pay | Admitting: Family Medicine

## 2012-02-10 NOTE — Telephone Encounter (Signed)
Error

## 2012-02-12 ENCOUNTER — Ambulatory Visit (INDEPENDENT_AMBULATORY_CARE_PROVIDER_SITE_OTHER)
Admission: RE | Admit: 2012-02-12 | Discharge: 2012-02-12 | Disposition: A | Discharge status: Home / Self Care | Payer: Medicare Other | Source: Ambulatory Visit | Attending: Internal Medicine | Admitting: Internal Medicine

## 2012-02-12 DIAGNOSIS — M949 Disorder of cartilage, unspecified: Secondary | ICD-10-CM

## 2012-02-12 DIAGNOSIS — M8580 Other specified disorders of bone density and structure, unspecified site: Secondary | ICD-10-CM

## 2012-03-09 ENCOUNTER — Other Ambulatory Visit: Payer: Self-pay | Admitting: Internal Medicine

## 2012-03-12 ENCOUNTER — Other Ambulatory Visit: Payer: Self-pay | Admitting: Internal Medicine

## 2012-03-12 MED ORDER — GLYBURIDE-METFORMIN 2.5-500 MG PO TABS: 1.0000 | ORAL_TABLET | Freq: Two times a day (BID) | ORAL | Status: DC

## 2012-04-24 ENCOUNTER — Other Ambulatory Visit: Payer: Self-pay

## 2012-05-16 ENCOUNTER — Other Ambulatory Visit: Payer: Self-pay | Admitting: Internal Medicine

## 2012-06-17 LAB — HM DIABETES EYE EXAM

## 2012-06-28 ENCOUNTER — Telehealth: Payer: Self-pay | Admitting: Internal Medicine

## 2012-06-28 NOTE — Telephone Encounter (Signed)
PT called and stated that her iinsurance will not cover any type of glyBURIDE-metformin (GLUCOVANCE) 2.5-500 MG per tablet. She is completely out of it and needs a new RX of a different medication of the same nature. Please assist.

## 2012-06-28 NOTE — Telephone Encounter (Signed)
I spoke to the pt.  Her insurance will cover glipizide/meformin hcl.  Please advise.  Thanks!!

## 2012-06-29 ENCOUNTER — Other Ambulatory Visit: Payer: Self-pay | Admitting: Family Medicine

## 2012-06-29 MED ORDER — GLIPIZIDE-METFORMIN HCL 2.5-500 MG PO TABS: 1.0000 | ORAL_TABLET | Freq: Two times a day (BID) | ORAL | Status: DC

## 2012-06-29 NOTE — Telephone Encounter (Signed)
I sent in the new medication per [facility].  Please have her monitor her blood sugar readings.  Thanks!!

## 2012-06-29 NOTE — Telephone Encounter (Signed)
Called pt she is aware

## 2012-06-30 IMAGING — CR DG LUMBAR SPINE 1V
1 series · 1 of 1 positions shown · non-contrast
Comparison: 12/06/2010

CLINICAL DATA: Lumbar laminectomy, decompression.

LUMBAR SPINE - 1 VIEW

[view not recorded]
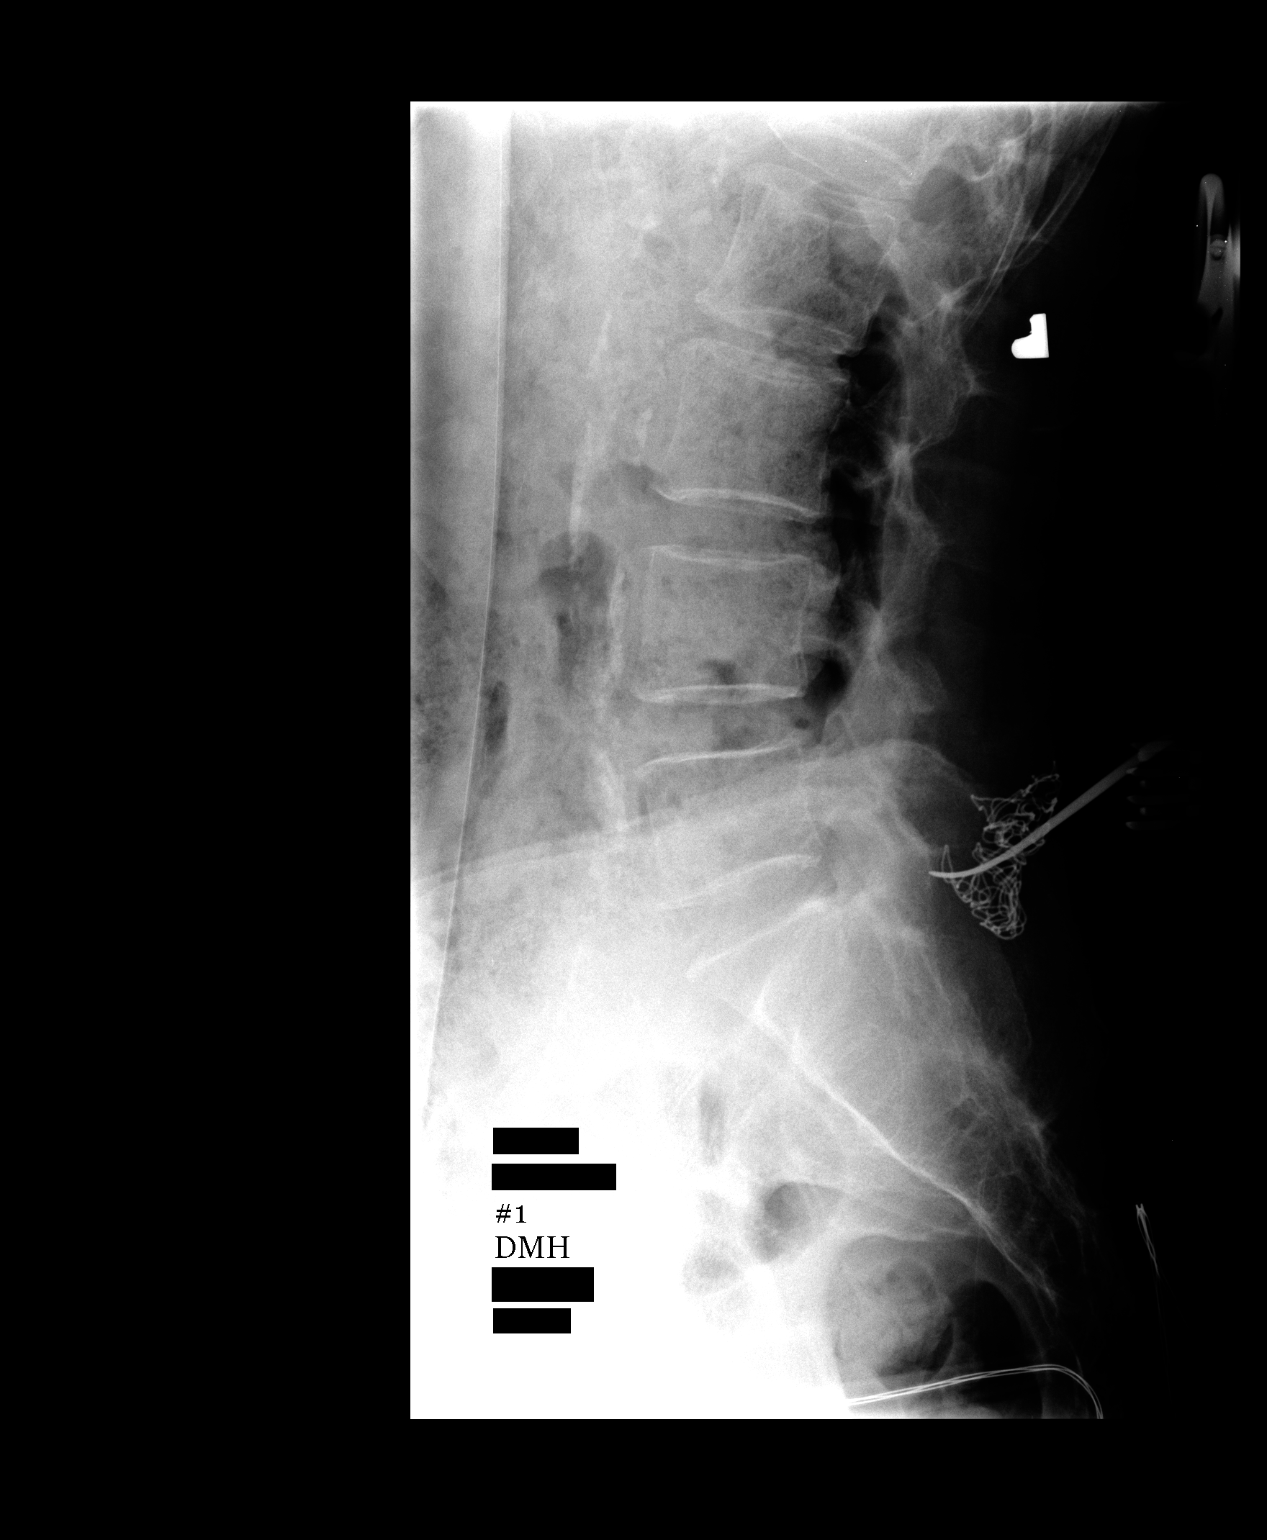

[1 of 1 positions shown; findings below may reference images not displayed]

FINDINGS: Single lateral intraoperative image demonstrates
posterior surgical instruments at the L5-S1 level.
IMPRESSION: Intraoperative localization as above.

## 2012-08-10 ENCOUNTER — Other Ambulatory Visit: Payer: Self-pay | Admitting: Family Medicine

## 2012-08-13 ENCOUNTER — Encounter: Payer: Medicare Other | Admitting: Internal Medicine

## 2012-08-23 ENCOUNTER — Other Ambulatory Visit: Payer: Self-pay | Admitting: Internal Medicine

## 2012-09-20 ENCOUNTER — Encounter: Payer: Self-pay | Admitting: Internal Medicine

## 2012-09-20 ENCOUNTER — Ambulatory Visit (INDEPENDENT_AMBULATORY_CARE_PROVIDER_SITE_OTHER): Payer: Medicare Other | Admitting: Internal Medicine

## 2012-09-20 VITALS — BP 156/70 | HR 63 | Temp 97.6°F | Ht 65.75 in | Wt 131.0 lb

## 2012-09-20 DIAGNOSIS — E1151 Type 2 diabetes mellitus with diabetic peripheral angiopathy without gangrene: Secondary | ICD-10-CM

## 2012-09-20 DIAGNOSIS — Z0000 Encounter for general adult medical examination without abnormal findings: Secondary | ICD-10-CM

## 2012-09-20 DIAGNOSIS — M25562 Pain in left knee: Secondary | ICD-10-CM

## 2012-09-20 DIAGNOSIS — I779 Disorder of arteries and arterioles, unspecified: Secondary | ICD-10-CM

## 2012-09-20 DIAGNOSIS — E0590 Thyrotoxicosis, unspecified without thyrotoxic crisis or storm: Secondary | ICD-10-CM

## 2012-09-20 DIAGNOSIS — F17200 Nicotine dependence, unspecified, uncomplicated: Secondary | ICD-10-CM

## 2012-09-20 DIAGNOSIS — I739 Peripheral vascular disease, unspecified: Secondary | ICD-10-CM

## 2012-09-20 DIAGNOSIS — E785 Hyperlipidemia, unspecified: Secondary | ICD-10-CM

## 2012-09-20 DIAGNOSIS — I798 Other disorders of arteries, arterioles and capillaries in diseases classified elsewhere: Secondary | ICD-10-CM

## 2012-09-20 DIAGNOSIS — G8929 Other chronic pain: Secondary | ICD-10-CM | POA: Insufficient documentation

## 2012-09-20 DIAGNOSIS — M25569 Pain in unspecified knee: Secondary | ICD-10-CM

## 2012-09-20 DIAGNOSIS — E1159 Type 2 diabetes mellitus with other circulatory complications: Secondary | ICD-10-CM

## 2012-09-20 DIAGNOSIS — I10 Essential (primary) hypertension: Secondary | ICD-10-CM

## 2012-09-20 LAB — CBC WITH DIFFERENTIAL/PLATELET
Basophils Absolute: 0 10*3/uL (ref 0.0–0.1)
Eosinophils Relative: 2.7 % (ref 0.0–5.0)
MCV: 92.1 fl (ref 78.0–100.0)
Monocytes Absolute: 0.6 10*3/uL (ref 0.1–1.0)
Neutrophils Relative %: 63.2 % (ref 43.0–77.0)
Platelets: 223 10*3/uL (ref 150.0–400.0)
RDW: 13 % (ref 11.5–14.6)

## 2012-09-20 LAB — HEPATIC FUNCTION PANEL
AST: 19 U/L (ref 0–37)
Total Bilirubin: 0.8 mg/dL (ref 0.3–1.2)

## 2012-09-20 LAB — LIPID PANEL
Cholesterol: 145 mg/dL (ref 0–200)
HDL: 62.9 mg/dL (ref >39.00)
Triglycerides: 117 mg/dL (ref 0.0–149.0)
VLDL: 23.4 mg/dL (ref 0.0–40.0)

## 2012-09-20 LAB — BASIC METABOLIC PANEL
CO2: 31 mEq/L (ref 19–32)
Calcium: 10.9 mg/dL — ABNORMAL HIGH (ref 8.4–10.5)
Glucose, Bld: 108 mg/dL — ABNORMAL HIGH (ref 70–99)
Potassium: 3.7 mEq/L (ref 3.5–5.1)
Sodium: 140 mEq/L (ref 135–145)

## 2012-09-20 LAB — TSH: TSH: 0.38 u[IU]/mL (ref 0.35–5.50)

## 2012-09-20 LAB — MICROALBUMIN / CREATININE URINE RATIO: Microalb Creat Ratio: 0.5 mg/g (ref 0.0–30.0)

## 2012-09-20 LAB — HEMOGLOBIN A1C: Hgb A1c MFr Bld: 6.4 % (ref 4.6–6.5)
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
## 2012-09-20 NOTE — Patient Instructions (Signed)
Will notify you  of labs when available. Causes of pain can be the knee joint  Nerve pain and circulation pain.   It seems that this is  Knee pain  . Consider going back to dr Thurston Hole. Also retry Voltaren gel?topically advil aleve can elevated blood pressure otherwise .  If labs ok then ROV in 6 months with Hg a1c pre visit

## 2012-09-30 ENCOUNTER — Other Ambulatory Visit: Payer: Self-pay | Admitting: Family Medicine

## 2012-10-04 ENCOUNTER — Other Ambulatory Visit (INDEPENDENT_AMBULATORY_CARE_PROVIDER_SITE_OTHER): Payer: Medicare Other

## 2012-10-05 LAB — PTH, INTACT AND CALCIUM: PTH: 45.3 pg/mL (ref 14.0–72.0)

## 2012-10-13 ENCOUNTER — Other Ambulatory Visit: Payer: Self-pay

## 2012-10-13 ENCOUNTER — Other Ambulatory Visit: Payer: Self-pay | Admitting: Family Medicine

## 2012-10-13 MED ORDER — LISINOPRIL 40 MG PO TABS: 40.0000 mg | ORAL_TABLET | Freq: Every day | ORAL | Status: DC

## 2012-10-20 ENCOUNTER — Encounter: Payer: Self-pay | Admitting: Internal Medicine

## 2012-10-24 ENCOUNTER — Encounter: Payer: Self-pay | Admitting: Internal Medicine

## 2012-10-26 ENCOUNTER — Other Ambulatory Visit: Payer: Self-pay | Admitting: Family

## 2012-10-26 MED ORDER — AMLODIPINE BESYLATE 2.5 MG PO TABS: 2.5000 mg | ORAL_TABLET | Freq: Every day | ORAL | Status: DC

## 2012-11-02 ENCOUNTER — Other Ambulatory Visit (INDEPENDENT_AMBULATORY_CARE_PROVIDER_SITE_OTHER): Payer: Medicare Other

## 2012-11-02 DIAGNOSIS — I10 Essential (primary) hypertension: Secondary | ICD-10-CM

## 2012-11-02 LAB — BASIC METABOLIC PANEL
CO2: 30 mEq/L (ref 19–32)
Calcium: 10.3 mg/dL (ref 8.4–10.5)
Chloride: 105 mEq/L (ref 96–112)
Creatinine, Ser: 0.7 mg/dL (ref 0.4–1.2)
Glucose, Bld: 104 mg/dL — ABNORMAL HIGH (ref 70–99)
Sodium: 140 mEq/L (ref 135–145)

## 2012-11-03 ENCOUNTER — Other Ambulatory Visit: Payer: Medicare Other

## 2012-11-11 ENCOUNTER — Encounter: Payer: Self-pay | Admitting: Internal Medicine

## 2012-11-11 ENCOUNTER — Ambulatory Visit (INDEPENDENT_AMBULATORY_CARE_PROVIDER_SITE_OTHER): Payer: Medicare Other | Admitting: Internal Medicine

## 2012-11-11 VITALS — BP 162/70 | HR 57 | Temp 97.8°F | Wt 132.0 lb

## 2012-11-11 DIAGNOSIS — D485 Neoplasm of uncertain behavior of skin: Secondary | ICD-10-CM

## 2012-11-11 DIAGNOSIS — I10 Essential (primary) hypertension: Secondary | ICD-10-CM

## 2012-11-11 MED ORDER — AMLODIPINE BESYLATE 5 MG PO TABS: 5.0000 mg | ORAL_TABLET | Freq: Every day | ORAL | Status: DC

## 2012-11-11 MED ORDER — LISINOPRIL 40 MG PO TABS: 40.0000 mg | ORAL_TABLET | Freq: Every day | ORAL | Status: DC
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
## 2012-11-11 NOTE — Patient Instructions (Addendum)
Continue lisinopril 40 mg once a day Stay off the calcium for now We will increase the amlodipine to 5 mg once a day Check blood pressure readings at least 3 times a week sit and relax no tobacco discard the first reading count a second reading. His blood pressure very high 170 and above after 2-3 weeks of this regimen contact our office with readings  Otherwise plan followup visit in about 6 weeks.  Suggest the right hand and left leg lesions be checked by dermatology [facility] Address: [address] [place], [place] [address] [phone]

## 2012-11-20 ENCOUNTER — Encounter: Payer: Self-pay | Admitting: Internal Medicine

## 2012-11-24 ENCOUNTER — Telehealth: Payer: Self-pay | Admitting: Family Medicine

## 2012-11-24 NOTE — Telephone Encounter (Signed)
Inform patient that it may take 4 weeks to get full response from medication However because readings are still very high adivse increase to norvasc 7.5 mg per day Keep fu visit . [name] please Change rx so she has enough meds for a month fu To take 3- 2.5 mg norvasc per day Disp 90 . Thanks WP ----- Message ----- From: Nils Flack, CMA Sent: 11/22/2012 8:39 AM To: Madelin Headings, MD Subject: FW: Non-Urgent Medical Question ----- Message ----- From: Kathyrn Drown Sent: 11/22/2012 7:48 AM To: Nils Flack, CMA Subject: FW: Non-Urgent Medical Question ----- Message ----- From: Jordan [name] Sent: 11/20/2012 4:38 PM To: [facility] Clinical Pool Subject: Non-Urgent Medical Question As you suggested, I have taken my blood pressure three times this week. I have discarded the first reading and am providing you with the second readings: Monday, 11-15-12 at 6:30 pm 183/66 Wed. 11-18-11 12:45 pm 179/72 Sat. 9-13/14 4:oo pm 167/63 Saturday's may be down since I have been resting most of the day.

## 2012-11-25 NOTE — Telephone Encounter (Signed)
Left message with the patient's husband for pt to return my call.

## 2012-11-30 ENCOUNTER — Other Ambulatory Visit: Payer: Self-pay | Admitting: Family Medicine

## 2012-11-30 ENCOUNTER — Other Ambulatory Visit (HOSPITAL_COMMUNITY): Payer: Self-pay | Admitting: Orthopedic Surgery

## 2012-11-30 DIAGNOSIS — M25562 Pain in left knee: Secondary | ICD-10-CM

## 2012-11-30 MED ORDER — AMLODIPINE BESYLATE 2.5 MG PO TABS: 7.5000 mg | ORAL_TABLET | Freq: Every day | ORAL | Status: DC

## 2012-11-30 NOTE — Telephone Encounter (Signed)
Patient notified by telephone.  New rx sent to the pharmacy.

## 2012-12-03 ENCOUNTER — Encounter (HOSPITAL_COMMUNITY)
Admission: RE | Admit: 2012-12-03 | Discharge: 2012-12-03 | Disposition: A | Discharge status: Home / Self Care | Payer: Medicare Other | Source: Ambulatory Visit | Attending: Orthopedic Surgery | Admitting: Orthopedic Surgery

## 2012-12-03 ENCOUNTER — Encounter (HOSPITAL_COMMUNITY): Payer: Self-pay

## 2012-12-03 DIAGNOSIS — M25569 Pain in unspecified knee: Secondary | ICD-10-CM | POA: Insufficient documentation

## 2012-12-03 DIAGNOSIS — M25562 Pain in left knee: Secondary | ICD-10-CM

## 2012-12-03 MED ORDER — TECHNETIUM TC 99M MEDRONATE IV KIT
25.2000 | PACK | Freq: Once | INTRAVENOUS | Status: AC | PRN
  Administered 2012-12-03: 25.2 via INTRAVENOUS

## 2012-12-08 LAB — HM MAMMOGRAPHY

## 2012-12-10 ENCOUNTER — Encounter: Payer: Self-pay | Admitting: Internal Medicine

## 2012-12-24 ENCOUNTER — Encounter: Payer: Self-pay | Admitting: Internal Medicine

## 2012-12-24 ENCOUNTER — Ambulatory Visit (INDEPENDENT_AMBULATORY_CARE_PROVIDER_SITE_OTHER): Payer: Medicare Other | Admitting: Internal Medicine

## 2012-12-24 VITALS — BP 160/70 | HR 65 | Temp 98.2°F | Wt 136.0 lb

## 2012-12-24 DIAGNOSIS — I798 Other disorders of arteries, arterioles and capillaries in diseases classified elsewhere: Secondary | ICD-10-CM

## 2012-12-24 DIAGNOSIS — M25569 Pain in unspecified knee: Secondary | ICD-10-CM

## 2012-12-24 DIAGNOSIS — E1159 Type 2 diabetes mellitus with other circulatory complications: Secondary | ICD-10-CM

## 2012-12-24 DIAGNOSIS — G8929 Other chronic pain: Secondary | ICD-10-CM

## 2012-12-24 DIAGNOSIS — F439 Reaction to severe stress, unspecified: Secondary | ICD-10-CM

## 2012-12-24 DIAGNOSIS — E1151 Type 2 diabetes mellitus with diabetic peripheral angiopathy without gangrene: Secondary | ICD-10-CM

## 2012-12-24 DIAGNOSIS — I739 Peripheral vascular disease, unspecified: Secondary | ICD-10-CM

## 2012-12-24 DIAGNOSIS — Z733 Stress, not elsewhere classified: Secondary | ICD-10-CM

## 2012-12-24 DIAGNOSIS — I10 Essential (primary) hypertension: Secondary | ICD-10-CM

## 2012-12-24 DIAGNOSIS — M1712 Unilateral primary osteoarthritis, left knee: Secondary | ICD-10-CM

## 2012-12-24 DIAGNOSIS — M1710 Unilateral primary osteoarthritis, unspecified knee: Secondary | ICD-10-CM

## 2012-12-24 MED ORDER — HYDROCHLOROTHIAZIDE 25 MG PO TABS: 25.0000 mg | ORAL_TABLET | Freq: Every day | ORAL | Status: DC

## 2012-12-24 MED ORDER — DICLOFENAC SODIUM 1 % TD GEL: 4.0000 g | Freq: Four times a day (QID) | TRANSDERMAL | Status: DC
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
## 2012-12-24 NOTE — Patient Instructions (Addendum)
Try topical voltaren  To the joint   .  Up to 4 x per day Ok to re try diuretic at this time off of the calcium and see how you do.  Stop the nabumetome as it is not helping and could elevated your blood pressure.  ROV in 4-6 weeks  Or contact us with readings   About bp effect   So we can make plan.  consdier ation of lasix if needed or  Adding something like clonidine

## 2013-01-14 ENCOUNTER — Encounter: Payer: Self-pay | Admitting: Internal Medicine

## 2013-01-21 ENCOUNTER — Ambulatory Visit (INDEPENDENT_AMBULATORY_CARE_PROVIDER_SITE_OTHER): Payer: Medicare Other | Admitting: Internal Medicine

## 2013-01-21 ENCOUNTER — Encounter: Payer: Self-pay | Admitting: Internal Medicine

## 2013-01-21 VITALS — BP 140/60 | HR 62 | Temp 98.0°F | Wt 138.0 lb

## 2013-01-21 DIAGNOSIS — E119 Type 2 diabetes mellitus without complications: Secondary | ICD-10-CM

## 2013-01-21 DIAGNOSIS — I10 Essential (primary) hypertension: Secondary | ICD-10-CM

## 2013-01-21 LAB — MAGNESIUM: Magnesium: 1.7 mg/dL (ref 1.5–2.5)

## 2013-01-21 LAB — BASIC METABOLIC PANEL
BUN: 22 mg/dL (ref 6–23)
CO2: 33 mEq/L — ABNORMAL HIGH (ref 19–32)
Calcium: 11 mg/dL — ABNORMAL HIGH (ref 8.4–10.5)
Creatinine, Ser: 0.7 mg/dL (ref 0.4–1.2)
GFR: 90.37 mL/min (ref >60.00)
Sodium: 138 mEq/L (ref 135–145)

## 2013-01-21 LAB — HEMOGLOBIN A1C: Hgb A1c MFr Bld: 7.1 % — ABNORMAL HIGH (ref 4.6–6.5)

## 2013-01-21 MED ORDER — LISINOPRIL-HYDROCHLOROTHIAZIDE 20-12.5 MG PO TABS: 2.0000 | ORAL_TABLET | Freq: Every day | ORAL | Status: DC
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
## 2013-01-21 NOTE — Patient Instructions (Addendum)
Will notify you  of labs when available. continue check bp readings a few times a week  Should continue to decrease.  FU in 3 -4 months depending on labs

## 2013-01-28 ENCOUNTER — Other Ambulatory Visit: Payer: Self-pay | Admitting: Family Medicine

## 2013-01-28 ENCOUNTER — Encounter: Payer: Self-pay | Admitting: Internal Medicine

## 2013-01-28 MED ORDER — ATENOLOL 25 MG PO TABS: 25.0000 mg | ORAL_TABLET | Freq: Two times a day (BID) | ORAL | Status: DC

## 2013-02-09 ENCOUNTER — Other Ambulatory Visit (HOSPITAL_COMMUNITY): Payer: Medicare Other

## 2013-02-09 ENCOUNTER — Ambulatory Visit: Payer: Medicare Other | Admitting: Family

## 2013-02-16 ENCOUNTER — Other Ambulatory Visit: Payer: Self-pay | Admitting: Internal Medicine

## 2013-02-18 ENCOUNTER — Other Ambulatory Visit: Payer: Self-pay | Admitting: Internal Medicine

## 2013-02-18 DIAGNOSIS — E210 Primary hyperparathyroidism: Secondary | ICD-10-CM

## 2013-02-22 ENCOUNTER — Ambulatory Visit
Admission: RE | Admit: 2013-02-22 | Discharge: 2013-02-22 | Disposition: A | Discharge status: Home / Self Care | Payer: Medicare Other | Source: Ambulatory Visit | Attending: Internal Medicine | Admitting: Internal Medicine

## 2013-02-22 DIAGNOSIS — E210 Primary hyperparathyroidism: Secondary | ICD-10-CM

## 2013-02-28 ENCOUNTER — Other Ambulatory Visit: Payer: Self-pay | Admitting: Family Medicine

## 2013-02-28 ENCOUNTER — Encounter: Payer: Self-pay | Admitting: Internal Medicine

## 2013-03-04 ENCOUNTER — Telehealth: Payer: Self-pay

## 2013-03-04 NOTE — Telephone Encounter (Signed)
Jordan Sheffield NP called to make Dr. Fabian Sharp aware that pt has diabetes and does not check it regularly.  Today pt's urine was checked and it was neg for protein and had 3+ sugar (glucose).  Per NP pt's blood sugar could possibly be close to or over 200.  Pt is currently taking glipizide-metformin bid.  Pls advise.

## 2013-03-04 NOTE — Telephone Encounter (Signed)
Called and spoke with pt and pt is aware of Dr. Rosezella Florida recommendations.

## 2013-03-04 NOTE — Telephone Encounter (Signed)
Last a1c was acceptable. This seems to have gotten worse recenetly  Have patient check BG fasting for 2 weeks and  2 hours after eating at least 3 x per week   Have her send in these readings   To me and depending on results fu visit  Earlier than  February.

## 2013-03-07 ENCOUNTER — Encounter: Payer: Self-pay | Admitting: Family

## 2013-03-08 ENCOUNTER — Ambulatory Visit (HOSPITAL_COMMUNITY)
Admission: RE | Admit: 2013-03-08 | Discharge: 2013-03-08 | Disposition: A | Discharge status: Home / Self Care | Payer: Medicare Other | Source: Ambulatory Visit | Attending: Family | Admitting: Family

## 2013-03-08 ENCOUNTER — Encounter: Payer: Self-pay | Admitting: *Deleted

## 2013-03-08 ENCOUNTER — Ambulatory Visit (INDEPENDENT_AMBULATORY_CARE_PROVIDER_SITE_OTHER): Payer: Medicare Other | Admitting: Family

## 2013-03-08 DIAGNOSIS — I6529 Occlusion and stenosis of unspecified carotid artery: Secondary | ICD-10-CM

## 2013-03-08 NOTE — Progress Notes (Signed)
Patient left after non-invasive vascular lab studies, before being brought back to exam room to discuss results. Results reviewed. Letter sent to patient, return in 6 months, repeat carotid Duplex. Letter advises patient that her ICA stenosis has progressed, and to call 911 for any symptoms of stroke, and to call our office if she has questions or concerns.

## 2013-03-09 ENCOUNTER — Encounter: Payer: Self-pay | Admitting: Vascular Surgery

## 2013-03-09 ENCOUNTER — Other Ambulatory Visit: Payer: Self-pay | Admitting: Internal Medicine

## 2013-03-10 DIAGNOSIS — Z9049 Acquired absence of other specified parts of digestive tract: Secondary | ICD-10-CM

## 2013-03-10 HISTORY — DX: Acquired absence of other specified parts of digestive tract: Z90.49

## 2013-03-15 ENCOUNTER — Other Ambulatory Visit: Payer: Self-pay | Admitting: Internal Medicine

## 2013-03-18 ENCOUNTER — Other Ambulatory Visit: Payer: Self-pay | Admitting: Family Medicine

## 2013-03-18 MED ORDER — ATENOLOL 25 MG PO TABS: 25.0000 mg | ORAL_TABLET | Freq: Two times a day (BID) | ORAL | Status: DC

## 2013-03-18 MED ORDER — AMLODIPINE BESYLATE 5 MG PO TABS: ORAL_TABLET | ORAL | Status: DC

## 2013-03-18 NOTE — Telephone Encounter (Signed)
Pt also needs refill on amlodipine

## 2013-03-20 ENCOUNTER — Encounter: Payer: Self-pay | Admitting: Internal Medicine

## 2013-03-24 ENCOUNTER — Ambulatory Visit: Payer: Medicare Other | Admitting: Internal Medicine

## 2013-03-27 ENCOUNTER — Encounter: Payer: Self-pay | Admitting: Internal Medicine

## 2013-03-28 ENCOUNTER — Other Ambulatory Visit: Payer: Self-pay | Admitting: Family Medicine

## 2013-03-28 DIAGNOSIS — Z0110 Encounter for examination of ears and hearing without abnormal findings: Secondary | ICD-10-CM

## 2013-03-28 MED ORDER — METFORMIN HCL ER 500 MG PO TB24: 500.0000 mg | ORAL_TABLET | Freq: Every day | ORAL | Status: DC

## 2013-04-09 ENCOUNTER — Encounter: Payer: Self-pay | Admitting: Internal Medicine

## 2013-05-06 ENCOUNTER — Ambulatory Visit: Payer: Medicare Other | Admitting: Internal Medicine

## 2013-05-08 HISTORY — PX: CHOLECYSTECTOMY, LAPAROSCOPIC: SHX56

## 2013-05-08 HISTORY — PX: CHOLECYSTECTOMY: SHX55

## 2013-05-24 ENCOUNTER — Ambulatory Visit: Payer: Medicare Other | Admitting: Internal Medicine

## 2013-05-27 ENCOUNTER — Ambulatory Visit (INDEPENDENT_AMBULATORY_CARE_PROVIDER_SITE_OTHER): Payer: Medicare Other | Admitting: Internal Medicine

## 2013-05-27 ENCOUNTER — Encounter: Payer: Self-pay | Admitting: Internal Medicine

## 2013-05-27 VITALS — BP 152/62 | Temp 97.8°F | Ht 65.75 in | Wt 133.0 lb

## 2013-05-27 DIAGNOSIS — I739 Peripheral vascular disease, unspecified: Secondary | ICD-10-CM

## 2013-05-27 DIAGNOSIS — Z9049 Acquired absence of other specified parts of digestive tract: Secondary | ICD-10-CM

## 2013-05-27 DIAGNOSIS — E785 Hyperlipidemia, unspecified: Secondary | ICD-10-CM

## 2013-05-27 DIAGNOSIS — E1151 Type 2 diabetes mellitus with diabetic peripheral angiopathy without gangrene: Secondary | ICD-10-CM

## 2013-05-27 DIAGNOSIS — I798 Other disorders of arteries, arterioles and capillaries in diseases classified elsewhere: Secondary | ICD-10-CM

## 2013-05-27 DIAGNOSIS — E1159 Type 2 diabetes mellitus with other circulatory complications: Secondary | ICD-10-CM

## 2013-05-27 DIAGNOSIS — F17200 Nicotine dependence, unspecified, uncomplicated: Secondary | ICD-10-CM

## 2013-05-27 DIAGNOSIS — Z9089 Acquired absence of other organs: Secondary | ICD-10-CM

## 2013-05-27 DIAGNOSIS — I779 Disorder of arteries and arterioles, unspecified: Secondary | ICD-10-CM

## 2013-05-27 DIAGNOSIS — I6529 Occlusion and stenosis of unspecified carotid artery: Secondary | ICD-10-CM

## 2013-05-27 DIAGNOSIS — I10 Essential (primary) hypertension: Secondary | ICD-10-CM

## 2013-05-27 MED ORDER — LISINOPRIL 20 MG PO TABS: 20.0000 mg | ORAL_TABLET | Freq: Every day | ORAL | Status: DC

## 2013-05-27 MED ORDER — ROSUVASTATIN CALCIUM 10 MG PO TABS: ORAL_TABLET | ORAL | Status: DC

## 2013-05-27 NOTE — Patient Instructions (Addendum)
Add back the plain lisinopril  To the atenolol and the amlodipine .  Another option to increase the amlodipine to 10 if needed.  Check  Lab in 2-4 weeks   Make appt I will put in orders  You get lab appt .  Let us know bp readings after 2 months  month  Will make decision about whether to increase the amlodipine to 10 per day .
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
## 2013-05-28 ENCOUNTER — Encounter: Payer: Self-pay | Admitting: Internal Medicine

## 2013-05-28 DIAGNOSIS — Z9049 Acquired absence of other specified parts of digestive tract: Secondary | ICD-10-CM | POA: Insufficient documentation

## 2013-05-30 ENCOUNTER — Telehealth: Payer: Self-pay | Admitting: Internal Medicine

## 2013-05-30 NOTE — Telephone Encounter (Signed)
Relevant patient education assigned to patient using Emmi. ° °

## 2013-06-03 ENCOUNTER — Encounter: Payer: Self-pay | Admitting: Internal Medicine

## 2013-06-03 MED ORDER — METFORMIN HCL ER 500 MG PO TB24: 1000.0000 mg | ORAL_TABLET | Freq: Every day | ORAL | Status: DC

## 2013-06-03 NOTE — Telephone Encounter (Signed)
Yes increase to 2 per day and then let us know glucose reading after a few weeks   i updated her med list

## 2013-06-07 ENCOUNTER — Telehealth: Payer: Self-pay

## 2013-06-07 NOTE — Telephone Encounter (Signed)
Relevant patient education assigned to patient using Emmi. ° °

## 2013-06-08 ENCOUNTER — Other Ambulatory Visit (INDEPENDENT_AMBULATORY_CARE_PROVIDER_SITE_OTHER): Payer: Medicare Other

## 2013-06-08 ENCOUNTER — Other Ambulatory Visit: Payer: Medicare Other

## 2013-06-08 DIAGNOSIS — I779 Disorder of arteries and arterioles, unspecified: Secondary | ICD-10-CM

## 2013-06-08 DIAGNOSIS — F17200 Nicotine dependence, unspecified, uncomplicated: Secondary | ICD-10-CM

## 2013-06-08 DIAGNOSIS — E785 Hyperlipidemia, unspecified: Secondary | ICD-10-CM

## 2013-06-08 DIAGNOSIS — I10 Essential (primary) hypertension: Secondary | ICD-10-CM

## 2013-06-08 DIAGNOSIS — E1159 Type 2 diabetes mellitus with other circulatory complications: Secondary | ICD-10-CM

## 2013-06-08 DIAGNOSIS — E1151 Type 2 diabetes mellitus with diabetic peripheral angiopathy without gangrene: Secondary | ICD-10-CM

## 2013-06-08 DIAGNOSIS — I6529 Occlusion and stenosis of unspecified carotid artery: Secondary | ICD-10-CM

## 2013-06-08 DIAGNOSIS — I739 Peripheral vascular disease, unspecified: Secondary | ICD-10-CM

## 2013-06-08 DIAGNOSIS — I798 Other disorders of arteries, arterioles and capillaries in diseases classified elsewhere: Secondary | ICD-10-CM

## 2013-06-08 LAB — HEPATIC FUNCTION PANEL
ALT: 17 U/L (ref 0–35)
AST: 16 U/L (ref 0–37)
Albumin: 3.9 g/dL (ref 3.5–5.2)
Alkaline Phosphatase: 82 U/L (ref 39–117)
Bilirubin, Direct: 0 mg/dL (ref 0.0–0.3)
Total Bilirubin: 0.6 mg/dL (ref 0.3–1.2)
Total Protein: 6.4 g/dL (ref 6.0–8.3)

## 2013-06-08 LAB — LIPID PANEL
Cholesterol: 127 mg/dL (ref 0–200)
HDL: 53.6 mg/dL (ref >39.00)
LDL Cholesterol: 54 mg/dL (ref 0–99)
Total CHOL/HDL Ratio: 2
Triglycerides: 97 mg/dL (ref 0.0–149.0)
VLDL: 19.4 mg/dL (ref 0.0–40.0)

## 2013-06-08 LAB — TSH: TSH: 1.63 u[IU]/mL (ref 0.35–5.50)

## 2013-06-08 LAB — BASIC METABOLIC PANEL
BUN: 18 mg/dL (ref 6–23)
CHLORIDE: 101 meq/L (ref 96–112)
CO2: 29 mEq/L (ref 19–32)
CREATININE: 0.7 mg/dL (ref 0.4–1.2)
Calcium: 10 mg/dL (ref 8.4–10.5)
GFR: 87.3 mL/min (ref >60.00)
Glucose, Bld: 175 mg/dL — ABNORMAL HIGH (ref 70–99)
Potassium: 4.7 mEq/L (ref 3.5–5.1)
Sodium: 136 mEq/L (ref 135–145)

## 2013-06-08 LAB — HEMOGLOBIN A1C: HEMOGLOBIN A1C: 9.1 % — AB (ref 4.6–6.5)

## 2013-06-12 ENCOUNTER — Encounter: Payer: Self-pay | Admitting: Internal Medicine

## 2013-06-13 MED ORDER — METFORMIN HCL ER 500 MG PO TB24: 1000.0000 mg | ORAL_TABLET | Freq: Every day | ORAL | Status: DC

## 2013-06-13 NOTE — Telephone Encounter (Signed)
Sent to the pharmacy by e-scribe. 

## 2013-06-14 ENCOUNTER — Other Ambulatory Visit: Payer: Self-pay | Admitting: Family Medicine

## 2013-06-14 DIAGNOSIS — E119 Type 2 diabetes mellitus without complications: Secondary | ICD-10-CM

## 2013-06-14 MED ORDER — SITAGLIPTIN PHOSPHATE 100 MG PO TABS: 100.0000 mg | ORAL_TABLET | Freq: Every day | ORAL | Status: DC

## 2013-06-15 NOTE — Telephone Encounter (Signed)
[name] didn't i answer this?

## 2013-07-10 ENCOUNTER — Encounter: Payer: Self-pay | Admitting: Internal Medicine

## 2013-07-11 MED ORDER — SITAGLIPTIN PHOSPHATE 100 MG PO TABS: 100.0000 mg | ORAL_TABLET | Freq: Every day | ORAL | Status: DC

## 2013-07-11 NOTE — Telephone Encounter (Signed)
90 day supply of medication sent to the pharmacy per patient request.

## 2013-08-28 ENCOUNTER — Other Ambulatory Visit: Payer: Self-pay | Admitting: Internal Medicine

## 2013-08-29 NOTE — Telephone Encounter (Signed)
Sent to the pharmacy by e-scribe. 

## 2013-09-06 ENCOUNTER — Other Ambulatory Visit (HOSPITAL_COMMUNITY): Payer: Self-pay | Admitting: Internal Medicine

## 2013-09-06 ENCOUNTER — Inpatient Hospital Stay (HOSPITAL_COMMUNITY): Admission: RE | Admit: 2013-09-06 | Payer: Medicare Other | Source: Ambulatory Visit

## 2013-09-07 ENCOUNTER — Ambulatory Visit: Payer: Medicare Other | Admitting: Family

## 2013-09-07 ENCOUNTER — Other Ambulatory Visit (HOSPITAL_COMMUNITY): Payer: Medicare Other

## 2013-09-10 ENCOUNTER — Other Ambulatory Visit: Payer: Self-pay | Admitting: Internal Medicine

## 2013-09-12 NOTE — Telephone Encounter (Signed)
Sent to the pharmacy by e-scribe. 

## 2013-09-16 ENCOUNTER — Other Ambulatory Visit (INDEPENDENT_AMBULATORY_CARE_PROVIDER_SITE_OTHER): Payer: Medicare Other

## 2013-09-16 DIAGNOSIS — E119 Type 2 diabetes mellitus without complications: Secondary | ICD-10-CM

## 2013-09-16 LAB — HEMOGLOBIN A1C: Hgb A1c MFr Bld: 6.8 % — ABNORMAL HIGH (ref 4.6–6.5)

## 2013-09-23 ENCOUNTER — Encounter: Payer: Self-pay | Admitting: Internal Medicine

## 2013-09-23 ENCOUNTER — Ambulatory Visit (INDEPENDENT_AMBULATORY_CARE_PROVIDER_SITE_OTHER): Payer: Medicare Other | Admitting: Internal Medicine

## 2013-09-23 ENCOUNTER — Ambulatory Visit (INDEPENDENT_AMBULATORY_CARE_PROVIDER_SITE_OTHER)
Admission: RE | Admit: 2013-09-23 | Discharge: 2013-09-23 | Disposition: A | Discharge status: Home / Self Care | Payer: Medicare Other | Source: Ambulatory Visit | Attending: Internal Medicine | Admitting: Internal Medicine

## 2013-09-23 VITALS — BP 160/70 | Temp 98.3°F | Ht 65.75 in | Wt 122.0 lb

## 2013-09-23 DIAGNOSIS — E785 Hyperlipidemia, unspecified: Secondary | ICD-10-CM

## 2013-09-23 DIAGNOSIS — F17200 Nicotine dependence, unspecified, uncomplicated: Secondary | ICD-10-CM

## 2013-09-23 DIAGNOSIS — R351 Nocturia: Secondary | ICD-10-CM

## 2013-09-23 DIAGNOSIS — R634 Abnormal weight loss: Secondary | ICD-10-CM

## 2013-09-23 DIAGNOSIS — E1159 Type 2 diabetes mellitus with other circulatory complications: Secondary | ICD-10-CM

## 2013-09-23 DIAGNOSIS — R0602 Shortness of breath: Secondary | ICD-10-CM

## 2013-09-23 DIAGNOSIS — M810 Age-related osteoporosis without current pathological fracture: Secondary | ICD-10-CM

## 2013-09-23 DIAGNOSIS — J189 Pneumonia, unspecified organism: Secondary | ICD-10-CM

## 2013-09-23 DIAGNOSIS — I10 Essential (primary) hypertension: Secondary | ICD-10-CM

## 2013-09-23 DIAGNOSIS — E1151 Type 2 diabetes mellitus with diabetic peripheral angiopathy without gangrene: Secondary | ICD-10-CM

## 2013-09-23 DIAGNOSIS — I798 Other disorders of arteries, arterioles and capillaries in diseases classified elsewhere: Secondary | ICD-10-CM

## 2013-09-23 LAB — POCT URINALYSIS DIP (MANUAL ENTRY)
Bilirubin, UA: NEGATIVE
GLUCOSE UA: NEGATIVE
Ketones, POC UA: NEGATIVE
LEUKOCYTES UA: NEGATIVE
Nitrite, UA: NEGATIVE
Protein Ur, POC: NEGATIVE
RBC UA: NEGATIVE
Spec Grav, UA: 1.015
Urobilinogen, UA: 0.2
pH, UA: 6.5

## 2013-09-23 MED ORDER — AMLODIPINE BESYLATE 10 MG PO TABS
10.0000 mg | ORAL_TABLET | Freq: Every day | ORAL | Status: DC
Start: 2013-09-23 — End: 2014-10-06
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
## 2013-09-23 NOTE — Patient Instructions (Addendum)
Diabetes  is much better  But concern about your weight loss. Plan  Pneumonia follow up.   Chest x ray  To be  done .  May do a pulmonary referral .   increase the norvasc   To 10 mg .  For now   ROV in 4-6 weeks and   Other work up in the interim

## 2013-09-24 DIAGNOSIS — R0602 Shortness of breath: Secondary | ICD-10-CM | POA: Insufficient documentation

## 2013-09-24 DIAGNOSIS — R634 Abnormal weight loss: Secondary | ICD-10-CM | POA: Insufficient documentation

## 2013-09-24 DIAGNOSIS — R351 Nocturia: Secondary | ICD-10-CM | POA: Insufficient documentation

## 2013-09-24 DIAGNOSIS — J189 Pneumonia, unspecified organism: Secondary | ICD-10-CM | POA: Insufficient documentation

## 2013-09-30 ENCOUNTER — Other Ambulatory Visit: Payer: Self-pay | Admitting: Internal Medicine

## 2013-10-03 NOTE — Telephone Encounter (Signed)
Sent by e-scribe.  Pt has upcoming appt on 10/28/13

## 2013-10-06 ENCOUNTER — Telehealth: Payer: Self-pay | Admitting: *Deleted

## 2013-10-06 DIAGNOSIS — E785 Hyperlipidemia, unspecified: Secondary | ICD-10-CM

## 2013-10-06 DIAGNOSIS — E119 Type 2 diabetes mellitus without complications: Secondary | ICD-10-CM

## 2013-10-06 NOTE — Telephone Encounter (Signed)
Patient has had an appointment Diabetic [facility]

## 2013-10-11 ENCOUNTER — Encounter: Payer: Self-pay | Admitting: Family

## 2013-10-12 ENCOUNTER — Ambulatory Visit (HOSPITAL_COMMUNITY)
Admission: RE | Admit: 2013-10-12 | Discharge: 2013-10-12 | Disposition: A | Discharge status: Home / Self Care | Payer: Medicare Other | Source: Ambulatory Visit | Attending: Family | Admitting: Family

## 2013-10-12 ENCOUNTER — Encounter: Payer: Self-pay | Admitting: Family

## 2013-10-12 ENCOUNTER — Other Ambulatory Visit (HOSPITAL_COMMUNITY): Payer: Medicare Other

## 2013-10-12 ENCOUNTER — Ambulatory Visit (INDEPENDENT_AMBULATORY_CARE_PROVIDER_SITE_OTHER): Payer: Medicare Other | Admitting: Family

## 2013-10-12 ENCOUNTER — Ambulatory Visit: Payer: Medicare Other | Admitting: Family

## 2013-10-12 VITALS — BP 148/69 | HR 66 | Resp 16 | Ht 66.0 in | Wt 122.0 lb

## 2013-10-12 DIAGNOSIS — I6529 Occlusion and stenosis of unspecified carotid artery: Secondary | ICD-10-CM | POA: Diagnosis not present

## 2013-10-12 NOTE — Patient Instructions (Signed)
Stroke Prevention Some medical conditions and behaviors are associated with an increased chance of having a stroke. You may prevent a stroke by making healthy choices and managing medical conditions. HOW CAN I REDUCE MY RISK OF HAVING A STROKE?   Stay physically active. Get at least 30 minutes of activity on most or all days.  Do not smoke. It may also be helpful to avoid exposure to secondhand smoke.  Limit alcohol use. Moderate alcohol use is considered to be:  No more than 2 drinks per day for men.  No more than 1 drink per day for nonpregnant women.  Eat healthy foods. This involves:  Eating 5 or more servings of fruits and vegetables a day.  Making dietary changes that address high blood pressure (hypertension), high cholesterol, diabetes, or obesity.  Manage your cholesterol levels.  Making food choices that are high in fiber and low in saturated fat, trans fat, and cholesterol may control cholesterol levels.  Take any prescribed medicines to control cholesterol as directed by your health care provider.  Manage your diabetes.  Controlling your carbohydrate and sugar intake is recommended to manage diabetes.  Take any prescribed medicines to control diabetes as directed by your health care provider.  Control your hypertension.  Making food choices that are low in salt (sodium), saturated fat, trans fat, and cholesterol is recommended to manage hypertension.  Take any prescribed medicines to control hypertension as directed by your health care provider.  Maintain a healthy weight.  Reducing calorie intake and making food choices that are low in sodium, saturated fat, trans fat, and cholesterol are recommended to manage weight.  Stop drug abuse.  Avoid taking birth control pills.  Talk to your health care provider about the risks of taking birth control pills if you are over 35 years old, smoke, get migraines, or have ever had a blood clot.  Get evaluated for sleep  disorders (sleep apnea).  Talk to your health care provider about getting a sleep evaluation if you snore a lot or have excessive sleepiness.  Take medicines only as directed by your health care provider.  For some people, aspirin or blood thinners (anticoagulants) are helpful in reducing the risk of forming abnormal blood clots that can lead to stroke. If you have the irregular heart rhythm of atrial fibrillation, you should be on a blood thinner unless there is a good reason you cannot take them.  Understand all your medicine instructions.  Make sure that other conditions (such as anemia or atherosclerosis) are addressed. SEEK IMMEDIATE MEDICAL CARE IF:   You have sudden weakness or numbness of the face, arm, or leg, especially on one side of the body.  Your face or eyelid droops to one side.  You have sudden confusion.  You have trouble speaking (aphasia) or understanding.  You have sudden trouble seeing in one or both eyes.  You have sudden trouble walking.  You have dizziness.  You have a loss of balance or coordination.  You have a sudden, severe headache with no known cause.  You have new chest pain or an irregular heartbeat. Any of these symptoms may represent a serious problem that is an emergency. Do not wait to see if the symptoms will go away. Get medical help at once. Call your local emergency services (911 in [place] Do not drive yourself to the hospital. Document Released: 04/03/2004 Document Revised: 07/11/2013 Document Reviewed: 08/27/2012 Exit[employer] Patient Information 2015 [employer]. This information is not intended to replace advice given   to you by your health care provider. Make sure you discuss any questions you have with your health care provider.   Smoking Cessation Quitting smoking is important to your health and has many advantages. However, it is not always easy to quit since nicotine is a very addictive drug. Oftentimes, people try 3 times or more  before being able to quit. This document explains the best ways for you to prepare to quit smoking. Quitting takes hard work and a lot of effort, but you can do it. ADVANTAGES OF QUITTING SMOKING  You will live longer, feel better, and live better.  Your body will feel the impact of quitting smoking almost immediately.  Within 20 minutes, blood pressure decreases. Your pulse returns to its normal level.  After 8 hours, carbon monoxide levels in the blood return to normal. Your oxygen level increases.  After 24 hours, the chance of having a heart attack starts to decrease. Your breath, hair, and body stop smelling like smoke.  After 48 hours, damaged nerve endings begin to recover. Your sense of taste and smell improve.  After 72 hours, the body is virtually free of nicotine. Your bronchial tubes relax and breathing becomes easier.  After 2 to 12 weeks, lungs can hold more air. Exercise becomes easier and circulation improves.  The risk of having a heart attack, stroke, cancer, or lung disease is greatly reduced.  After 1 year, the risk of coronary heart disease is cut in half.  After 5 years, the risk of stroke falls to the same as a nonsmoker.  After 10 years, the risk of lung cancer is cut in half and the risk of other cancers decreases significantly.  After 15 years, the risk of coronary heart disease drops, usually to the level of a nonsmoker.  If you are pregnant, quitting smoking will improve your chances of having a healthy baby.  The people you live with, especially any children, will be healthier.  You will have extra money to spend on things other than cigarettes. QUESTIONS TO THINK ABOUT BEFORE ATTEMPTING TO QUIT You may want to talk about your answers with your health care provider.  Why do you want to quit?  If you tried to quit in the past, what helped and what did not?  What will be the most difficult situations for you after you quit? How will you plan to  handle them?  Who can help you through the tough times? Your family? Friends? A health care provider?  What pleasures do you get from smoking? What ways can you still get pleasure if you quit? Here are some questions to ask your health care provider:  How can you help me to be successful at quitting?  What medicine do you think would be best for me and how should I take it?  What should I do if I need more help?  What is smoking withdrawal like? How can I get information on withdrawal? GET READY  Set a quit date.  Change your environment by getting rid of all cigarettes, ashtrays, matches, and lighters in your home, car, or work. Do not let people smoke in your home.  Review your past attempts to quit. Think about what worked and what did not. GET SUPPORT AND ENCOURAGEMENT You have a better chance of being successful if you have help. You can get support in many ways.  Tell your family, friends, and coworkers that you are going to quit and need their support. Ask them not   to smoke around you.  Get individual, group, or telephone counseling and support. Programs are available at local hospitals and health centers. Call your local health department for information about programs in your area.  Spiritual beliefs and practices may help some smokers quit.  Download a "quit meter" on your computer to keep track of quit statistics, such as how long you have gone without smoking, cigarettes not smoked, and money saved.  Get a self-help book about quitting smoking and staying off tobacco. LEARN NEW SKILLS AND BEHAVIORS  Distract yourself from urges to smoke. Talk to someone, go for a walk, or occupy your time with a task.  Change your normal routine. Take a different route to work. Drink tea instead of coffee. Eat breakfast in a different place.  Reduce your stress. Take a hot bath, exercise, or read a book.  Plan something enjoyable to do every day. Reward yourself for not  smoking.  Explore interactive web-based programs that specialize in helping you quit. GET MEDICINE AND USE IT CORRECTLY Medicines can help you stop smoking and decrease the urge to smoke. Combining medicine with the above behavioral methods and support can greatly increase your chances of successfully quitting smoking.  Nicotine replacement therapy helps deliver nicotine to your body without the negative effects and risks of smoking. Nicotine replacement therapy includes nicotine gum, lozenges, inhalers, nasal sprays, and skin patches. Some may be available over-the-counter and others require a prescription.  Antidepressant medicine helps people abstain from smoking, but how this works is unknown. This medicine is available by prescription.  Nicotinic receptor partial agonist medicine simulates the effect of nicotine in your brain. This medicine is available by prescription. Ask your health care provider for advice about which medicines to use and how to use them based on your health history. Your health care provider will tell you what side effects to look out for if you choose to be on a medicine or therapy. Carefully read the information on the package. Do not use any other product containing nicotine while using a nicotine replacement product.  RELAPSE OR DIFFICULT SITUATIONS Most relapses occur within the first 3 months after quitting. Do not be discouraged if you start smoking again. Remember, most people try several times before finally quitting. You may have symptoms of withdrawal because your body is used to nicotine. You may crave cigarettes, be irritable, feel very hungry, cough often, get headaches, or have difficulty concentrating. The withdrawal symptoms are only temporary. They are strongest when you first quit, but they will go away within 10-14 days. To reduce the chances of relapse, try to:  Avoid drinking alcohol. Drinking lowers your chances of successfully quitting.  Reduce the  amount of caffeine you consume. Once you quit smoking, the amount of caffeine in your body increases and can give you symptoms, such as a rapid heartbeat, sweating, and anxiety.  Avoid smokers because they can make you want to smoke.  Do not let weight gain distract you. Many smokers will gain weight when they quit, usually less than 10 pounds. Eat a healthy diet and stay active. You can always lose the weight gained after you quit.  Find ways to improve your mood other than smoking. FOR MORE INFORMATION  www.smokefree.gov  Document Released: 02/18/2001 Document Revised: 07/11/2013 Document Reviewed: 06/05/2011 [employer] Patient Information 2015 [employer]. This information is not intended to replace advice given to you by your health care provider. Make sure you discuss any questions you have with your health care   provider.  
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
## 2013-10-22 ENCOUNTER — Encounter: Payer: Self-pay | Admitting: Family

## 2013-10-28 ENCOUNTER — Encounter: Payer: Self-pay | Admitting: Internal Medicine

## 2013-10-28 ENCOUNTER — Ambulatory Visit (INDEPENDENT_AMBULATORY_CARE_PROVIDER_SITE_OTHER): Payer: Medicare Other | Admitting: Internal Medicine

## 2013-10-28 VITALS — BP 138/60 | Temp 98.6°F | Ht 65.75 in | Wt 122.0 lb

## 2013-10-28 DIAGNOSIS — I798 Other disorders of arteries, arterioles and capillaries in diseases classified elsewhere: Secondary | ICD-10-CM

## 2013-10-28 DIAGNOSIS — R634 Abnormal weight loss: Secondary | ICD-10-CM

## 2013-10-28 DIAGNOSIS — F17200 Nicotine dependence, unspecified, uncomplicated: Secondary | ICD-10-CM

## 2013-10-28 DIAGNOSIS — I10 Essential (primary) hypertension: Secondary | ICD-10-CM

## 2013-10-28 DIAGNOSIS — E1159 Type 2 diabetes mellitus with other circulatory complications: Secondary | ICD-10-CM

## 2013-10-28 DIAGNOSIS — I6529 Occlusion and stenosis of unspecified carotid artery: Secondary | ICD-10-CM

## 2013-10-28 DIAGNOSIS — E1151 Type 2 diabetes mellitus with diabetic peripheral angiopathy without gangrene: Secondary | ICD-10-CM
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
## 2013-10-28 NOTE — Patient Instructions (Signed)
Your blood sugar is much better . Blood pressure better on recheck  Check at home periodically to make sure  Below 145 range   Best is below.  140/90 .  Wt Readings from Last 3 Encounters:  10/28/13 122 lb (55.339 kg)  10/12/13 122 lb (55.339 kg)  09/23/13 122 lb (55.339 kg)   Will follow weight increase healthy high calorie  Peanut  Butter  Ok.   ROV in November December  .

## 2013-11-12 ENCOUNTER — Encounter: Payer: Self-pay | Admitting: Family

## 2013-12-13 LAB — HM MAMMOGRAPHY

## 2013-12-27 ENCOUNTER — Encounter: Payer: Self-pay | Admitting: Internal Medicine

## 2014-01-12 ENCOUNTER — Encounter: Payer: Self-pay | Admitting: Internal Medicine

## 2014-01-20 ENCOUNTER — Telehealth: Payer: Self-pay

## 2014-01-20 NOTE — Telephone Encounter (Signed)
Pt is in need of an AWV.

## 2014-02-21 ENCOUNTER — Other Ambulatory Visit (INDEPENDENT_AMBULATORY_CARE_PROVIDER_SITE_OTHER): Payer: Medicare Other

## 2014-02-21 DIAGNOSIS — E119 Type 2 diabetes mellitus without complications: Secondary | ICD-10-CM

## 2014-02-21 LAB — BASIC METABOLIC PANEL
BUN: 15 mg/dL (ref 6–23)
CO2: 31 mEq/L (ref 19–32)
CREATININE: 0.7 mg/dL (ref 0.4–1.2)
Calcium: 9.9 mg/dL (ref 8.4–10.5)
Chloride: 105 mEq/L (ref 96–112)
GFR: 85.71 mL/min (ref >60.00)
Glucose, Bld: 174 mg/dL — ABNORMAL HIGH (ref 70–99)
Potassium: 4.1 mEq/L (ref 3.5–5.1)
Sodium: 139 mEq/L (ref 135–145)

## 2014-02-21 LAB — MICROALBUMIN / CREATININE URINE RATIO
Creatinine,U: 173 mg/dL
MICROALB UR: 1.9 mg/dL (ref 0.0–1.9)
Microalb Creat Ratio: 1.1 mg/g (ref 0.0–30.0)

## 2014-02-21 LAB — HEMOGLOBIN A1C: Hgb A1c MFr Bld: 8 % — ABNORMAL HIGH (ref 4.6–6.5)

## 2014-02-28 ENCOUNTER — Ambulatory Visit (INDEPENDENT_AMBULATORY_CARE_PROVIDER_SITE_OTHER): Payer: Medicare Other | Admitting: Internal Medicine

## 2014-02-28 ENCOUNTER — Encounter: Payer: Self-pay | Admitting: Internal Medicine

## 2014-02-28 VITALS — BP 146/50 | Temp 98.5°F | Ht 66.75 in | Wt 125.0 lb

## 2014-02-28 DIAGNOSIS — I10 Essential (primary) hypertension: Secondary | ICD-10-CM

## 2014-02-28 DIAGNOSIS — I739 Peripheral vascular disease, unspecified: Secondary | ICD-10-CM

## 2014-02-28 DIAGNOSIS — F17200 Nicotine dependence, unspecified, uncomplicated: Secondary | ICD-10-CM

## 2014-02-28 DIAGNOSIS — Z720 Tobacco use: Secondary | ICD-10-CM

## 2014-02-28 DIAGNOSIS — Z79899 Other long term (current) drug therapy: Secondary | ICD-10-CM

## 2014-02-28 DIAGNOSIS — E1151 Type 2 diabetes mellitus with diabetic peripheral angiopathy without gangrene: Secondary | ICD-10-CM

## 2014-02-28 DIAGNOSIS — I779 Disorder of arteries and arterioles, unspecified: Secondary | ICD-10-CM
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
## 2014-02-28 NOTE — Patient Instructions (Signed)
Sugars not as good. Track the cookie and cereal .    Intake  and decrease as discussed   Can try protein shake and see if helps sugars. Still advise stop tobacco. Lungs sound like have damage from the tobacco . consider repeat  Lung function in the future . Talk about this next visit . Pharmacy shot to see how much medicare is charged for a given rx .

## 2014-03-02 DIAGNOSIS — I779 Disorder of arteries and arterioles, unspecified: Secondary | ICD-10-CM | POA: Insufficient documentation

## 2014-03-02 DIAGNOSIS — I10 Essential (primary) hypertension: Secondary | ICD-10-CM | POA: Insufficient documentation

## 2014-03-02 DIAGNOSIS — I739 Peripheral vascular disease, unspecified: Secondary | ICD-10-CM

## 2014-04-16 ENCOUNTER — Other Ambulatory Visit: Payer: Self-pay | Admitting: Internal Medicine

## 2014-04-17 NOTE — Telephone Encounter (Signed)
Sent to the pharmacy by e-scribe. 

## 2014-04-26 ENCOUNTER — Other Ambulatory Visit (HOSPITAL_COMMUNITY): Payer: Medicare Other

## 2014-04-26 ENCOUNTER — Ambulatory Visit: Payer: Medicare Other | Admitting: Family

## 2014-05-12 ENCOUNTER — Other Ambulatory Visit: Payer: Self-pay | Admitting: Internal Medicine

## 2014-05-12 NOTE — Telephone Encounter (Signed)
Sent to the pharmacy by e-scribe.  Pt has upcoming appt on 06/30/14

## 2014-05-20 ENCOUNTER — Other Ambulatory Visit: Payer: Self-pay | Admitting: Internal Medicine

## 2014-05-23 NOTE — Telephone Encounter (Signed)
Sent to the pharmacy by e-scribe.  Pt has upcoming medicare wellness on 06/30/14

## 2014-06-10 ENCOUNTER — Other Ambulatory Visit: Payer: Self-pay | Admitting: Internal Medicine

## 2014-06-12 NOTE — Telephone Encounter (Signed)
Sent to the pharmacy by e-scribe.  Pt has upcoming follow up. 

## 2014-06-23 ENCOUNTER — Other Ambulatory Visit (INDEPENDENT_AMBULATORY_CARE_PROVIDER_SITE_OTHER): Payer: PPO

## 2014-06-23 DIAGNOSIS — I10 Essential (primary) hypertension: Secondary | ICD-10-CM

## 2014-06-23 DIAGNOSIS — E119 Type 2 diabetes mellitus without complications: Secondary | ICD-10-CM | POA: Diagnosis not present

## 2014-06-23 LAB — BASIC METABOLIC PANEL
BUN: 19 mg/dL (ref 6–23)
CHLORIDE: 103 meq/L (ref 96–112)
CO2: 32 meq/L (ref 19–32)
Calcium: 10.3 mg/dL (ref 8.4–10.5)
Creatinine, Ser: 0.73 mg/dL (ref 0.40–1.20)
GFR: 82.93 mL/min (ref >60.00)
Glucose, Bld: 308 mg/dL — ABNORMAL HIGH (ref 70–99)
POTASSIUM: 4.8 meq/L (ref 3.5–5.1)
Sodium: 137 mEq/L (ref 135–145)

## 2014-06-23 LAB — HEMOGLOBIN A1C: HEMOGLOBIN A1C: 9.6 % — AB (ref 4.6–6.5)

## 2014-06-30 ENCOUNTER — Ambulatory Visit (INDEPENDENT_AMBULATORY_CARE_PROVIDER_SITE_OTHER): Payer: PPO | Admitting: Internal Medicine

## 2014-06-30 ENCOUNTER — Encounter: Payer: Self-pay | Admitting: Internal Medicine

## 2014-06-30 VITALS — BP 144/60 | Temp 98.3°F | Ht 66.75 in | Wt 121.0 lb

## 2014-06-30 DIAGNOSIS — I739 Peripheral vascular disease, unspecified: Secondary | ICD-10-CM

## 2014-06-30 DIAGNOSIS — I10 Essential (primary) hypertension: Secondary | ICD-10-CM | POA: Diagnosis not present

## 2014-06-30 DIAGNOSIS — F17200 Nicotine dependence, unspecified, uncomplicated: Secondary | ICD-10-CM

## 2014-06-30 DIAGNOSIS — E785 Hyperlipidemia, unspecified: Secondary | ICD-10-CM

## 2014-06-30 DIAGNOSIS — I779 Disorder of arteries and arterioles, unspecified: Secondary | ICD-10-CM

## 2014-06-30 DIAGNOSIS — Z79899 Other long term (current) drug therapy: Secondary | ICD-10-CM

## 2014-06-30 DIAGNOSIS — E1151 Type 2 diabetes mellitus with diabetic peripheral angiopathy without gangrene: Secondary | ICD-10-CM

## 2014-06-30 DIAGNOSIS — Z720 Tobacco use: Secondary | ICD-10-CM

## 2014-06-30 DIAGNOSIS — Z91120 Patient's intentional underdosing of medication regimen due to financial hardship: Secondary | ICD-10-CM

## 2014-06-30 MED ORDER — METFORMIN HCL ER 500 MG PO TB24: 1000.0000 mg | ORAL_TABLET | Freq: Two times a day (BID) | ORAL | Status: DC

## 2014-06-30 NOTE — Patient Instructions (Addendum)
Increase the metformin to 4  500 mg per day .   Increase to 3 or 1500 mg per day   And after a week then increase to 4 per day  .  2 twice  A day with meal  Work on  Diet and life style interventions  . Other  meds    Consider insulin  Cost value.  There are other groups  But cost issue Or  Back to   sulfonaurea type mediation  Like glyburide  Low dose.  Will plan on looking into the vascular team options  Vascular   resources
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
## 2014-07-03 MED ORDER — FREESTYLE LANCETS MISC: Status: AC

## 2014-07-03 MED ORDER — GLUCOSE BLOOD VI STRP: ORAL_STRIP | Status: DC

## 2014-07-03 MED ORDER — FREESTYLE SYSTEM KIT: PACK | Status: AC

## 2014-07-16 ENCOUNTER — Other Ambulatory Visit: Payer: Self-pay | Admitting: Internal Medicine

## 2014-07-17 NOTE — Telephone Encounter (Signed)
Sent to the pharmacy by e-scribe.  Pt has upcoming appt on 10/06/14

## 2014-08-13 ENCOUNTER — Other Ambulatory Visit: Payer: Self-pay | Admitting: Internal Medicine

## 2014-08-16 NOTE — Telephone Encounter (Signed)
Sent to the pharmacy by e-scribe.  Pt has upcoming wellness on 10/06/14

## 2014-09-29 ENCOUNTER — Other Ambulatory Visit (INDEPENDENT_AMBULATORY_CARE_PROVIDER_SITE_OTHER): Payer: PPO

## 2014-09-29 DIAGNOSIS — E785 Hyperlipidemia, unspecified: Secondary | ICD-10-CM

## 2014-09-29 DIAGNOSIS — E119 Type 2 diabetes mellitus without complications: Secondary | ICD-10-CM

## 2014-09-29 DIAGNOSIS — I10 Essential (primary) hypertension: Secondary | ICD-10-CM

## 2014-09-29 LAB — LIPID PANEL
Cholesterol: 157 mg/dL (ref 0–200)
HDL: 58.1 mg/dL (ref >39.00)
LDL CALC: 82 mg/dL (ref 0–99)
NonHDL: 98.9
TRIGLYCERIDES: 87 mg/dL (ref 0.0–149.0)
Total CHOL/HDL Ratio: 3
VLDL: 17.4 mg/dL (ref 0.0–40.0)

## 2014-09-29 LAB — BASIC METABOLIC PANEL
BUN: 19 mg/dL (ref 6–23)
CO2: 28 meq/L (ref 19–32)
Calcium: 10.6 mg/dL — ABNORMAL HIGH (ref 8.4–10.5)
Chloride: 105 mEq/L (ref 96–112)
Creatinine, Ser: 0.74 mg/dL (ref 0.40–1.20)
GFR: 81.58 mL/min (ref >60.00)
Glucose, Bld: 185 mg/dL — ABNORMAL HIGH (ref 70–99)
POTASSIUM: 4.1 meq/L (ref 3.5–5.1)
Sodium: 141 mEq/L (ref 135–145)

## 2014-09-29 LAB — HEMOGLOBIN A1C: Hgb A1c MFr Bld: 8.2 % — ABNORMAL HIGH (ref 4.6–6.5)

## 2014-10-03 ENCOUNTER — Encounter: Payer: PPO | Admitting: Internal Medicine

## 2014-10-06 ENCOUNTER — Ambulatory Visit (INDEPENDENT_AMBULATORY_CARE_PROVIDER_SITE_OTHER): Payer: PPO | Admitting: Internal Medicine

## 2014-10-06 ENCOUNTER — Encounter: Payer: Self-pay | Admitting: Internal Medicine

## 2014-10-06 VITALS — BP 122/64 | HR 78 | Temp 98.1°F | Resp 16 | Wt 117.0 lb

## 2014-10-06 DIAGNOSIS — Z2821 Immunization not carried out because of patient refusal: Secondary | ICD-10-CM

## 2014-10-06 DIAGNOSIS — E785 Hyperlipidemia, unspecified: Secondary | ICD-10-CM

## 2014-10-06 DIAGNOSIS — Z0000 Encounter for general adult medical examination without abnormal findings: Secondary | ICD-10-CM

## 2014-10-06 DIAGNOSIS — R634 Abnormal weight loss: Secondary | ICD-10-CM | POA: Diagnosis not present

## 2014-10-06 DIAGNOSIS — I739 Peripheral vascular disease, unspecified: Secondary | ICD-10-CM

## 2014-10-06 DIAGNOSIS — E039 Hypothyroidism, unspecified: Secondary | ICD-10-CM

## 2014-10-06 DIAGNOSIS — I779 Disorder of arteries and arterioles, unspecified: Secondary | ICD-10-CM

## 2014-10-06 DIAGNOSIS — E1151 Type 2 diabetes mellitus with diabetic peripheral angiopathy without gangrene: Secondary | ICD-10-CM | POA: Diagnosis not present

## 2014-10-06 DIAGNOSIS — I10 Essential (primary) hypertension: Secondary | ICD-10-CM

## 2014-10-06 DIAGNOSIS — E210 Primary hyperparathyroidism: Secondary | ICD-10-CM

## 2014-10-06 DIAGNOSIS — F17200 Nicotine dependence, unspecified, uncomplicated: Secondary | ICD-10-CM

## 2014-10-06 DIAGNOSIS — Z720 Tobacco use: Secondary | ICD-10-CM

## 2014-10-06 DIAGNOSIS — Z79899 Other long term (current) drug therapy: Secondary | ICD-10-CM | POA: Diagnosis not present

## 2014-10-06 MED ORDER — SITAGLIPTIN PHOSPHATE 100 MG PO TABS: 100.0000 mg | ORAL_TABLET | Freq: Every day | ORAL | Status: DC

## 2014-10-06 MED ORDER — AMLODIPINE BESYLATE 10 MG PO TABS: 10.0000 mg | ORAL_TABLET | Freq: Every day | ORAL | Status: DC

## 2014-10-06 MED ORDER — LISINOPRIL 20 MG PO TABS: 20.0000 mg | ORAL_TABLET | Freq: Every day | ORAL | Status: DC

## 2014-10-06 MED ORDER — ATENOLOL 25 MG PO TABS: 25.0000 mg | ORAL_TABLET | Freq: Two times a day (BID) | ORAL | Status: DC

## 2014-10-06 NOTE — Patient Instructions (Signed)
  Your blood pressure is better today. Uncertain why you're losing weight.  We'll plan nutrition referral consult in regard to how to boost her caloric intake without aggravating the diabetes. There are supplements like an sure that are meant for diabetics. If the weight loss continues we will do more of a workup including a chest x-ray. You can try a week or 2 of half dose of the metformin to see if it makes any difference in your appetite and weight. Still advise stopping tobacco. Goal of diabetes would be to get her A1c below 8 preferably 7-7-1/2. Plan our OV in about 3 -36months because of the weight loss situation. Planbmp and hga1c pre visit
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
## 2014-10-07 ENCOUNTER — Other Ambulatory Visit: Payer: Self-pay | Admitting: Internal Medicine

## 2014-10-09 NOTE — Telephone Encounter (Signed)
Sent to the pharmacy by e-scribe. 

## 2014-10-26 ENCOUNTER — Telehealth: Payer: Self-pay | Admitting: Internal Medicine

## 2014-10-26 MED ORDER — ATENOLOL 25 MG PO TABS: 25.0000 mg | ORAL_TABLET | Freq: Two times a day (BID) | ORAL | Status: DC

## 2014-10-26 NOTE — Telephone Encounter (Signed)
Pt needs a refill on atenolol 25 mg #90 send to [facility]

## 2014-10-26 NOTE — Telephone Encounter (Signed)
Sent to the pharmacy by e-scribe. 

## 2014-11-07 ENCOUNTER — Encounter: Payer: PPO | Attending: Internal Medicine | Admitting: Dietician

## 2014-11-07 ENCOUNTER — Ambulatory Visit (INDEPENDENT_AMBULATORY_CARE_PROVIDER_SITE_OTHER)
Admission: RE | Admit: 2014-11-07 | Discharge: 2014-11-07 | Disposition: A | Discharge status: Home / Self Care | Payer: PPO | Source: Ambulatory Visit | Attending: Internal Medicine | Admitting: Internal Medicine

## 2014-11-07 ENCOUNTER — Encounter: Payer: Self-pay | Admitting: Dietician

## 2014-11-07 VITALS — Ht 66.5 in | Wt 113.0 lb

## 2014-11-07 DIAGNOSIS — E1151 Type 2 diabetes mellitus with diabetic peripheral angiopathy without gangrene: Secondary | ICD-10-CM | POA: Diagnosis not present

## 2014-11-07 DIAGNOSIS — Z713 Dietary counseling and surveillance: Secondary | ICD-10-CM | POA: Insufficient documentation

## 2014-11-07 DIAGNOSIS — Z720 Tobacco use: Secondary | ICD-10-CM

## 2014-11-07 DIAGNOSIS — R634 Abnormal weight loss: Secondary | ICD-10-CM

## 2014-11-07 DIAGNOSIS — F17200 Nicotine dependence, unspecified, uncomplicated: Secondary | ICD-10-CM

## 2014-11-07 DIAGNOSIS — R636 Underweight: Secondary | ICD-10-CM | POA: Diagnosis not present

## 2014-11-07 NOTE — Patient Instructions (Signed)
Consider stopping smoking. Please get your lungs evaluated Try not to go more than 3-4 hours without eating.    Last meal or snack should be 2 hours before bed. Aim for 2-3 meals and 2-3 snacks per day. Aim for higher calorie foods, nuts, cheese, whole milk, whole milk yogurt, protein shakes or sugar free carnation breakfast essentials. Aim for 3 Carb Choices per meal (45 grams) +/- 1 either way  Aim for 0-2 Carbs per snack if hungry  Include protein in moderation with your meals and snacks

## 2014-11-07 NOTE — Progress Notes (Signed)
Diabetes Self-Management Education  Visit Type: First/Initial  Appt. Start Time: 1415 Appt. End Time: 1610  11/07/2014  [name], identified by name and date of birth, is a 74 y.o. female with a diagnosis of Diabetes: Type 2.   Patient is here alone.  She denies depression although states that she is "grumpy" all of the time.  She has been losing weight for the past 2 years and weighed 130 lbs at that time.  She has been caring for her husband who is obese and does not qualify for knee surgery due to cardiac reasons.  He is incontinent.  She appears to lack social support.  She can leave the house at times.  She does all of the shopping and the cooking.  She denies depression.  Has problems sleeping due to having to get up every 2 hours to urinate.  She smokes and does not want to quit at this time.  She is audibly wheezing.  She reports needing to get a chest x-ray.  Intake is not adequate, appetite is poor except for breakfast which is her best meal of the day.  She is resistant to increasing milk, oils, mayo, butter.  She works at [employer] as a Scientist, water quality 2 days per week but does not eat the food there as it is too rich.  She does verbalize need to gain weight.  I am concerned that lung issue may be increasing caloric needs and overall intake is low in addition contributing to weight loss.    Wt Readings from Last 3 Encounters:  11/07/14 113 lb (51.256 kg)  10/06/14 117 lb (53.071 kg)  06/30/14 121 lb (54.885 kg)     ASSESSMENT  Height 5' 6.5" (1.689 m), weight 113 lb (51.256 kg). Body mass index is 17.97 kg/(m^2).      Diabetes Self-Management Education - 11/07/14 1419    Visit Information   Visit Type First/Initial   Initial Visit   Diabetes Type Type 2   Are you currently following a meal plan? No   Are you taking your medications as prescribed? Yes   Date Diagnosed about 20 years ago   Health Coping   How would you rate your overall health? Good   Psychosocial  Assessment   Patient Belief/Attitude about Diabetes Defeat/Burnout  frustration about diabetes trumps all motivation   Self-care barriers None   Self-management support Doctor's office;Family   Other persons present Patient   Patient Concerns Nutrition/Meal planning;Glycemic Control;Other (comment)  weight gain   Special Needs None   Preferred Learning Style No preference indicated   Learning Readiness Ready   How often do you need to have someone help you when you read instructions, pamphlets, or other written materials from your doctor or pharmacy? 1 - Never   What is the last grade level you completed in school? 12 grade HS plus business classes   Complications   Last HgB A1C per patient/outside source 8.2 %  09/29/14 decreased from 9.6 06/23/14   How often do you check your blood sugar? 0 times/day (not testing)  she cannot get the meter that she has to work properly, got frustrated and quit trying.   Number of hypoglycemic episodes per month 0   Have you had a dilated eye exam in the past 12 months? Yes   Have you had a dental exam in the past 12 months? Yes   Are you checking your feet? Yes   How many days per week are you checking  your feet? 7   Dietary Intake   Breakfast 2 poached eggs on honey wheat toast, cinnamon raisin toast with peanut butter, 4 oz skim milk OR oatmeal OR cereal, 3 cups black flavored coffee  10:30   Snack (morning) none   Lunch none   Snack (afternoon) occasional cheese and cracker OR peanuts OR Premier protein drink   Patent examiner, small amount of creamed potatoes, tomato, SF gelatin salad (cottage cheese, pineapple, nuts) OR legumes, vegetables  5-8 often not hungry and does not want but tries to eat   Snack (evening) occasional coffee and atkins bar   Beverage(s) unsweet tea, black coffee, 4 oz milk daily to take medication, very small amounts water, rare diet gingerale   Exercise   Exercise Type ADL's  hx of knee surgery and now chronic pain even  when not walking/standing.   How many days per week to you exercise? 0   How many minutes per day do you exercise? 0   Total minutes per week of exercise 0   Patient Education   Previous Diabetes Education Yes (please comment)  years ago   Disease state  Definition of diabetes, type 1 and 2, and the diagnosis of diabetes   Nutrition management  Role of diet in the treatment of diabetes and the relationship between the three main macronutrients and blood glucose level;Food label reading, portion sizes and measuring food.;Carbohydrate counting;Meal options for control of blood glucose level and chronic complications.   Monitoring Identified appropriate SMBG and/or A1C goals.;Daily foot exams;Yearly dilated eye exam   Chronic complications Relationship between chronic complications and blood glucose control   Psychosocial adjustment Worked with patient to identify barriers to care and solutions;Role of stress on diabetes   Personal strategies to promote health Review risk of smoking and offered smoking cessation;Lifestyle issues that need to be addressed for better diabetes care   Individualized Goals (developed by patient)   Nutrition General guidelines for healthy choices and portions discussed   Physical Activity Not Applicable   Medications take my medication as prescribed   Reducing Risk increase portions of healthy fats;increase portions of nuts and seeds;increase portions of olive oil in diet   Health Coping ask for help with (comment)  care of husband as needed   Outcomes   Future DMSE PRN   Program Status Completed      Individualized Plan for Diabetes Self-Management Training:   Learning Objective:  Patient will have a greater understanding of diabetes self-management. Patient education plan is to attend individual and/or group sessions per assessed needs and concerns.   Plan:   Patient Instructions  Consider stopping smoking. Please get your lungs evaluated Try not to go  more than 3-4 hours without eating.    Last meal or snack should be 2 hours before bed. Aim for 2-3 meals and 2-3 snacks per day. Aim for higher calorie foods, nuts, cheese, whole milk, whole milk yogurt, protein shakes or sugar free carnation breakfast essentials. Aim for 3 Carb Choices per meal (45 grams) +/- 1 either way  Aim for 0-2 Carbs per snack if hungry  Include protein in moderation with your meals and snacks     Expected Outcomes:    Patient expressed interest in learning but with some resistance to change and lack of confidence in her ability to change.  Question change over time.  Education material provided: Living Well with Diabetes, A1C conversion sheet and Snack sheet  If problems or questions, patient to contact team via:  Phone and Email  Future DSME appointment: PRN

## 2014-11-10 ENCOUNTER — Other Ambulatory Visit: Payer: Self-pay | Admitting: Internal Medicine

## 2014-11-14 LAB — HM DIABETES EYE EXAM

## 2014-11-14 NOTE — Telephone Encounter (Signed)
Sent to the pharmacy by e-scribe. 

## 2014-11-15 ENCOUNTER — Encounter: Payer: Self-pay | Admitting: Family Medicine

## 2014-11-15 ENCOUNTER — Encounter (INDEPENDENT_AMBULATORY_CARE_PROVIDER_SITE_OTHER): Payer: PPO | Admitting: Ophthalmology

## 2014-11-15 DIAGNOSIS — H43813 Vitreous degeneration, bilateral: Secondary | ICD-10-CM

## 2014-11-15 DIAGNOSIS — H35033 Hypertensive retinopathy, bilateral: Secondary | ICD-10-CM | POA: Diagnosis not present

## 2014-11-15 DIAGNOSIS — E11329 Type 2 diabetes mellitus with mild nonproliferative diabetic retinopathy without macular edema: Secondary | ICD-10-CM

## 2014-11-15 DIAGNOSIS — I10 Essential (primary) hypertension: Secondary | ICD-10-CM | POA: Diagnosis not present

## 2014-11-15 DIAGNOSIS — H3531 Nonexudative age-related macular degeneration: Secondary | ICD-10-CM

## 2014-11-15 DIAGNOSIS — H3532 Exudative age-related macular degeneration: Secondary | ICD-10-CM

## 2014-11-15 DIAGNOSIS — E11319 Type 2 diabetes mellitus with unspecified diabetic retinopathy without macular edema: Secondary | ICD-10-CM | POA: Diagnosis not present

## 2014-12-11 ENCOUNTER — Encounter (INDEPENDENT_AMBULATORY_CARE_PROVIDER_SITE_OTHER): Payer: PPO | Admitting: Ophthalmology

## 2014-12-12 ENCOUNTER — Encounter (INDEPENDENT_AMBULATORY_CARE_PROVIDER_SITE_OTHER): Payer: PPO | Admitting: Ophthalmology

## 2014-12-12 DIAGNOSIS — E113293 Type 2 diabetes mellitus with mild nonproliferative diabetic retinopathy without macular edema, bilateral: Secondary | ICD-10-CM

## 2014-12-12 DIAGNOSIS — I10 Essential (primary) hypertension: Secondary | ICD-10-CM

## 2014-12-12 DIAGNOSIS — E11319 Type 2 diabetes mellitus with unspecified diabetic retinopathy without macular edema: Secondary | ICD-10-CM

## 2014-12-12 DIAGNOSIS — H43813 Vitreous degeneration, bilateral: Secondary | ICD-10-CM | POA: Diagnosis not present

## 2014-12-12 DIAGNOSIS — H353211 Exudative age-related macular degeneration, right eye, with active choroidal neovascularization: Secondary | ICD-10-CM

## 2014-12-12 DIAGNOSIS — H2513 Age-related nuclear cataract, bilateral: Secondary | ICD-10-CM | POA: Diagnosis not present

## 2014-12-12 DIAGNOSIS — H35033 Hypertensive retinopathy, bilateral: Secondary | ICD-10-CM

## 2014-12-12 DIAGNOSIS — H353122 Nonexudative age-related macular degeneration, left eye, intermediate dry stage: Secondary | ICD-10-CM | POA: Diagnosis not present

## 2014-12-15 LAB — HM MAMMOGRAPHY

## 2014-12-18 ENCOUNTER — Encounter: Payer: Self-pay | Admitting: Family Medicine

## 2015-01-09 ENCOUNTER — Encounter (INDEPENDENT_AMBULATORY_CARE_PROVIDER_SITE_OTHER): Payer: PPO | Admitting: Ophthalmology

## 2015-01-09 DIAGNOSIS — I10 Essential (primary) hypertension: Secondary | ICD-10-CM | POA: Diagnosis not present

## 2015-01-09 DIAGNOSIS — E113211 Type 2 diabetes mellitus with mild nonproliferative diabetic retinopathy with macular edema, right eye: Secondary | ICD-10-CM

## 2015-01-09 DIAGNOSIS — H353211 Exudative age-related macular degeneration, right eye, with active choroidal neovascularization: Secondary | ICD-10-CM

## 2015-01-09 DIAGNOSIS — H35033 Hypertensive retinopathy, bilateral: Secondary | ICD-10-CM | POA: Diagnosis not present

## 2015-01-09 DIAGNOSIS — E113292 Type 2 diabetes mellitus with mild nonproliferative diabetic retinopathy without macular edema, left eye: Secondary | ICD-10-CM

## 2015-01-09 DIAGNOSIS — H353122 Nonexudative age-related macular degeneration, left eye, intermediate dry stage: Secondary | ICD-10-CM

## 2015-01-09 DIAGNOSIS — H2513 Age-related nuclear cataract, bilateral: Secondary | ICD-10-CM | POA: Diagnosis not present

## 2015-01-09 DIAGNOSIS — E11311 Type 2 diabetes mellitus with unspecified diabetic retinopathy with macular edema: Secondary | ICD-10-CM | POA: Diagnosis not present

## 2015-01-09 DIAGNOSIS — H43813 Vitreous degeneration, bilateral: Secondary | ICD-10-CM | POA: Diagnosis not present

## 2015-01-30 ENCOUNTER — Other Ambulatory Visit (INDEPENDENT_AMBULATORY_CARE_PROVIDER_SITE_OTHER): Payer: PPO

## 2015-01-30 DIAGNOSIS — I10 Essential (primary) hypertension: Secondary | ICD-10-CM

## 2015-01-30 DIAGNOSIS — E119 Type 2 diabetes mellitus without complications: Secondary | ICD-10-CM | POA: Diagnosis not present

## 2015-01-30 LAB — BASIC METABOLIC PANEL
BUN: 14 mg/dL (ref 6–23)
CALCIUM: 10.1 mg/dL (ref 8.4–10.5)
CO2: 29 meq/L (ref 19–32)
Chloride: 101 mEq/L (ref 96–112)
Creatinine, Ser: 0.55 mg/dL (ref 0.40–1.20)
GFR: 114.79 mL/min (ref >60.00)
GLUCOSE: 319 mg/dL — AB (ref 70–99)
Potassium: 4.6 mEq/L (ref 3.5–5.1)
SODIUM: 137 meq/L (ref 135–145)

## 2015-01-30 LAB — HEMOGLOBIN A1C: Hgb A1c MFr Bld: 10.2 % — ABNORMAL HIGH (ref 4.6–6.5)

## 2015-02-06 ENCOUNTER — Ambulatory Visit (INDEPENDENT_AMBULATORY_CARE_PROVIDER_SITE_OTHER): Payer: PPO | Admitting: Internal Medicine

## 2015-02-06 ENCOUNTER — Encounter: Payer: Self-pay | Admitting: Internal Medicine

## 2015-02-06 ENCOUNTER — Encounter (INDEPENDENT_AMBULATORY_CARE_PROVIDER_SITE_OTHER): Payer: PPO | Admitting: Ophthalmology

## 2015-02-06 VITALS — BP 140/80 | Temp 98.1°F | Ht 66.5 in | Wt 111.3 lb

## 2015-02-06 DIAGNOSIS — R634 Abnormal weight loss: Secondary | ICD-10-CM

## 2015-02-06 DIAGNOSIS — H353124 Nonexudative age-related macular degeneration, left eye, advanced atrophic with subfoveal involvement: Secondary | ICD-10-CM | POA: Diagnosis not present

## 2015-02-06 DIAGNOSIS — H43813 Vitreous degeneration, bilateral: Secondary | ICD-10-CM | POA: Diagnosis not present

## 2015-02-06 DIAGNOSIS — H3530 Unspecified macular degeneration: Secondary | ICD-10-CM | POA: Insufficient documentation

## 2015-02-06 DIAGNOSIS — E113293 Type 2 diabetes mellitus with mild nonproliferative diabetic retinopathy without macular edema, bilateral: Secondary | ICD-10-CM | POA: Diagnosis not present

## 2015-02-06 DIAGNOSIS — E1151 Type 2 diabetes mellitus with diabetic peripheral angiopathy without gangrene: Secondary | ICD-10-CM | POA: Diagnosis not present

## 2015-02-06 DIAGNOSIS — H35033 Hypertensive retinopathy, bilateral: Secondary | ICD-10-CM

## 2015-02-06 DIAGNOSIS — I10 Essential (primary) hypertension: Secondary | ICD-10-CM | POA: Diagnosis not present

## 2015-02-06 DIAGNOSIS — F17200 Nicotine dependence, unspecified, uncomplicated: Secondary | ICD-10-CM

## 2015-02-06 DIAGNOSIS — H353211 Exudative age-related macular degeneration, right eye, with active choroidal neovascularization: Secondary | ICD-10-CM

## 2015-02-06 DIAGNOSIS — Z79899 Other long term (current) drug therapy: Secondary | ICD-10-CM

## 2015-02-06 DIAGNOSIS — E11319 Type 2 diabetes mellitus with unspecified diabetic retinopathy without macular edema: Secondary | ICD-10-CM

## 2015-02-06 MED ORDER — GLIMEPIRIDE 2 MG PO TABS: 1.0000 mg | ORAL_TABLET | Freq: Every day | ORAL | Status: DC

## 2015-02-06 NOTE — Assessment & Plan Note (Signed)
Up today but reported ok at eye doc . Has been up and down  Taken off diuretic when calcium level rose  Now  Labile at times   willl fu at next visit   Has no se noted of the atenolol.  considier change  Coreg  iof not done but she wants to dec her pill count

## 2015-02-06 NOTE — Assessment & Plan Note (Signed)
Now out of congtrol  And losing weight  With dec appetite predated  Worse control. Declines insulin and prefers pills  Both have a risk  New machine freestyle given to monitor and fu after beginning amaryl low dose .     Celesta Gentile doesn't seem to help for the cost    Stay on the metformin  And recheck 6 weeks with  bg readings

## 2015-02-06 NOTE — Patient Instructions (Addendum)
Diabetes is now way out of control Advise start basal insulin  Every day   Or pills  Called sulfonyl urea  Can stop the [place]  As not helping miuch and also the fish oil  Could be bothering stomach Please check your bg twice a day for 1-2 weeks  When starting  .  Weight loss can be from  diabetes  Or medication  Other issues  If congoing would considier more evalutaion.  (consider seeing dr Buddy Duty for diabetes management opinion  ( endocrinology) as he is see ing you for thyroid etc }  Plan rov in 6-8 weeks with readings   Blood sugar
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
## 2015-02-06 NOTE — Assessment & Plan Note (Signed)
continueing but now has   High bg and could be related   Dec appetite and  Some abd discomfort no pain or vomiting or diarrhea

## 2015-02-28 ENCOUNTER — Encounter (INDEPENDENT_AMBULATORY_CARE_PROVIDER_SITE_OTHER): Payer: PPO | Admitting: Ophthalmology

## 2015-02-28 DIAGNOSIS — H35033 Hypertensive retinopathy, bilateral: Secondary | ICD-10-CM

## 2015-02-28 DIAGNOSIS — E113211 Type 2 diabetes mellitus with mild nonproliferative diabetic retinopathy with macular edema, right eye: Secondary | ICD-10-CM | POA: Diagnosis not present

## 2015-02-28 DIAGNOSIS — I10 Essential (primary) hypertension: Secondary | ICD-10-CM

## 2015-02-28 DIAGNOSIS — E113292 Type 2 diabetes mellitus with mild nonproliferative diabetic retinopathy without macular edema, left eye: Secondary | ICD-10-CM

## 2015-02-28 DIAGNOSIS — H43813 Vitreous degeneration, bilateral: Secondary | ICD-10-CM

## 2015-02-28 DIAGNOSIS — E11311 Type 2 diabetes mellitus with unspecified diabetic retinopathy with macular edema: Secondary | ICD-10-CM | POA: Diagnosis not present

## 2015-02-28 DIAGNOSIS — H353211 Exudative age-related macular degeneration, right eye, with active choroidal neovascularization: Secondary | ICD-10-CM

## 2015-02-28 DIAGNOSIS — H353122 Nonexudative age-related macular degeneration, left eye, intermediate dry stage: Secondary | ICD-10-CM

## 2015-03-20 ENCOUNTER — Encounter: Payer: Self-pay | Admitting: Internal Medicine

## 2015-03-20 ENCOUNTER — Ambulatory Visit (INDEPENDENT_AMBULATORY_CARE_PROVIDER_SITE_OTHER): Payer: PPO | Admitting: Internal Medicine

## 2015-03-20 VITALS — BP 128/52 | HR 83 | Temp 97.9°F | Wt 113.9 lb

## 2015-03-20 DIAGNOSIS — R1010 Upper abdominal pain, unspecified: Secondary | ICD-10-CM | POA: Diagnosis not present

## 2015-03-20 DIAGNOSIS — R634 Abnormal weight loss: Secondary | ICD-10-CM

## 2015-03-20 DIAGNOSIS — E1151 Type 2 diabetes mellitus with diabetic peripheral angiopathy without gangrene: Secondary | ICD-10-CM | POA: Diagnosis not present

## 2015-03-20 DIAGNOSIS — E785 Hyperlipidemia, unspecified: Secondary | ICD-10-CM

## 2015-03-20 DIAGNOSIS — I10 Essential (primary) hypertension: Secondary | ICD-10-CM | POA: Diagnosis not present

## 2015-03-20 DIAGNOSIS — H3530 Unspecified macular degeneration: Secondary | ICD-10-CM

## 2015-03-20 DIAGNOSIS — I739 Peripheral vascular disease, unspecified: Secondary | ICD-10-CM

## 2015-03-20 DIAGNOSIS — F17200 Nicotine dependence, unspecified, uncomplicated: Secondary | ICD-10-CM

## 2015-03-20 LAB — BASIC METABOLIC PANEL
BUN: 13 mg/dL (ref 6–23)
CALCIUM: 10.7 mg/dL — AB (ref 8.4–10.5)
CO2: 31 mEq/L (ref 19–32)
Chloride: 100 mEq/L (ref 96–112)
Creatinine, Ser: 0.7 mg/dL (ref 0.40–1.20)
GFR: 86.87 mL/min (ref >60.00)
GLUCOSE: 290 mg/dL — AB (ref 70–99)
POTASSIUM: 5.2 meq/L — AB (ref 3.5–5.1)
Sodium: 139 mEq/L (ref 135–145)

## 2015-03-20 MED ORDER — AMOXICILLIN 500 MG PO CAPS: ORAL_CAPSULE | ORAL | Status: DC
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
## 2015-03-20 NOTE — Progress Notes (Signed)
Pre visit review using our clinic review tool, if applicable. No additional management support is needed unless otherwise documented below in the visit note. 

## 2015-03-20 NOTE — Patient Instructions (Addendum)
Continue diabetes medication. wil plan rov  And labs  2 months   March  Ore thereabouts,  depending on scan  Plan abdominal ct scan be cause of the weight loss and stomach  Symptoms . May advise  Gi endoscopy other if unrevealing . Lab today

## 2015-03-22 ENCOUNTER — Telehealth: Payer: Self-pay | Admitting: Internal Medicine

## 2015-03-22 ENCOUNTER — Ambulatory Visit (INDEPENDENT_AMBULATORY_CARE_PROVIDER_SITE_OTHER)
Admission: RE | Admit: 2015-03-22 | Discharge: 2015-03-22 | Disposition: A | Discharge status: Home / Self Care | Payer: PPO | Source: Ambulatory Visit | Attending: Internal Medicine | Admitting: Internal Medicine

## 2015-03-22 ENCOUNTER — Other Ambulatory Visit: Payer: Self-pay | Admitting: Internal Medicine

## 2015-03-22 ENCOUNTER — Encounter: Payer: Self-pay | Admitting: Gastroenterology

## 2015-03-22 DIAGNOSIS — R1010 Upper abdominal pain, unspecified: Secondary | ICD-10-CM

## 2015-03-22 DIAGNOSIS — K8689 Other specified diseases of pancreas: Secondary | ICD-10-CM

## 2015-03-22 DIAGNOSIS — R634 Abnormal weight loss: Secondary | ICD-10-CM

## 2015-03-22 MED ORDER — IOHEXOL 300 MG/ML  SOLN: 100.0000 mL | Freq: Once | INTRAMUSCULAR | Status: DC | PRN

## 2015-03-22 NOTE — Telephone Encounter (Signed)
Called and spoke to gastro.  Moved her appt to 03/28/15 @ 9 AM.  Arrive at 8:45 AM.  Pt notified.

## 2015-03-22 NOTE — Telephone Encounter (Signed)
Pt can not see [facility] gi md until 05-11-15 and want to let dr Regis Bill know

## 2015-03-25 ENCOUNTER — Encounter: Payer: Self-pay | Admitting: Gastroenterology

## 2015-03-28 ENCOUNTER — Encounter (HOSPITAL_COMMUNITY): Payer: Self-pay | Admitting: *Deleted

## 2015-03-28 ENCOUNTER — Other Ambulatory Visit (INDEPENDENT_AMBULATORY_CARE_PROVIDER_SITE_OTHER): Payer: PPO

## 2015-03-28 ENCOUNTER — Ambulatory Visit (INDEPENDENT_AMBULATORY_CARE_PROVIDER_SITE_OTHER): Payer: PPO | Admitting: Gastroenterology

## 2015-03-28 ENCOUNTER — Encounter: Payer: Self-pay | Admitting: Gastroenterology

## 2015-03-28 VITALS — BP 116/58 | HR 76 | Ht 66.0 in | Wt 109.2 lb

## 2015-03-28 DIAGNOSIS — R634 Abnormal weight loss: Secondary | ICD-10-CM

## 2015-03-28 DIAGNOSIS — K869 Disease of pancreas, unspecified: Secondary | ICD-10-CM | POA: Diagnosis not present

## 2015-03-28 DIAGNOSIS — R1013 Epigastric pain: Secondary | ICD-10-CM

## 2015-03-28 DIAGNOSIS — K8689 Other specified diseases of pancreas: Secondary | ICD-10-CM

## 2015-03-28 LAB — HEPATIC FUNCTION PANEL
ALK PHOS: 126 U/L — AB (ref 39–117)
ALT: 19 U/L (ref 0–35)
AST: 21 U/L (ref 0–37)
Albumin: 4.2 g/dL (ref 3.5–5.2)
BILIRUBIN TOTAL: 0.6 mg/dL (ref 0.2–1.2)
Bilirubin, Direct: 0.1 mg/dL (ref 0.0–0.3)
Total Protein: 7.2 g/dL (ref 6.0–8.3)

## 2015-03-28 LAB — PROTIME-INR
INR: 1 ratio (ref 0.8–1.0)
Prothrombin Time: 10.3 s (ref 9.6–13.1)
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
## 2015-03-28 NOTE — Patient Instructions (Addendum)
Your physician has requested that you go to the basement for lab work before leaving today.  Please arrive at [facility] admission tomorrow 03/29/15 at 8:45 am. Your procedure will be at 10:15 am. Please be fasting after midnight.

## 2015-03-28 NOTE — Progress Notes (Signed)
I agree with the above note, plan 

## 2015-03-29 ENCOUNTER — Ambulatory Visit (HOSPITAL_COMMUNITY): Payer: PPO | Admitting: Anesthesiology

## 2015-03-29 ENCOUNTER — Telehealth: Payer: Self-pay | Admitting: *Deleted

## 2015-03-29 ENCOUNTER — Telehealth: Payer: Self-pay

## 2015-03-29 ENCOUNTER — Encounter (HOSPITAL_COMMUNITY): Payer: Self-pay | Admitting: Anesthesiology

## 2015-03-29 ENCOUNTER — Encounter (HOSPITAL_COMMUNITY)
Admission: RE | Disposition: A | Discharge status: Home / Self Care | Payer: Self-pay | Source: Ambulatory Visit | Attending: Gastroenterology

## 2015-03-29 ENCOUNTER — Ambulatory Visit (HOSPITAL_COMMUNITY)
Admission: RE | Admit: 2015-03-29 | Discharge: 2015-03-29 | Disposition: A | Discharge status: Home / Self Care | Payer: PPO | Source: Ambulatory Visit | Attending: Gastroenterology | Admitting: Gastroenterology

## 2015-03-29 ENCOUNTER — Other Ambulatory Visit: Payer: Self-pay | Admitting: Emergency Medicine

## 2015-03-29 DIAGNOSIS — Z7982 Long term (current) use of aspirin: Secondary | ICD-10-CM | POA: Diagnosis not present

## 2015-03-29 DIAGNOSIS — E1151 Type 2 diabetes mellitus with diabetic peripheral angiopathy without gangrene: Secondary | ICD-10-CM | POA: Insufficient documentation

## 2015-03-29 DIAGNOSIS — K8689 Other specified diseases of pancreas: Secondary | ICD-10-CM

## 2015-03-29 DIAGNOSIS — K869 Disease of pancreas, unspecified: Secondary | ICD-10-CM | POA: Diagnosis not present

## 2015-03-29 DIAGNOSIS — I10 Essential (primary) hypertension: Secondary | ICD-10-CM | POA: Insufficient documentation

## 2015-03-29 DIAGNOSIS — J449 Chronic obstructive pulmonary disease, unspecified: Secondary | ICD-10-CM | POA: Diagnosis not present

## 2015-03-29 DIAGNOSIS — F17210 Nicotine dependence, cigarettes, uncomplicated: Secondary | ICD-10-CM | POA: Diagnosis not present

## 2015-03-29 DIAGNOSIS — Z7984 Long term (current) use of oral hypoglycemic drugs: Secondary | ICD-10-CM | POA: Insufficient documentation

## 2015-03-29 DIAGNOSIS — R1013 Epigastric pain: Secondary | ICD-10-CM | POA: Diagnosis present

## 2015-03-29 DIAGNOSIS — M1990 Unspecified osteoarthritis, unspecified site: Secondary | ICD-10-CM | POA: Diagnosis not present

## 2015-03-29 DIAGNOSIS — C259 Malignant neoplasm of pancreas, unspecified: Secondary | ICD-10-CM

## 2015-03-29 DIAGNOSIS — Z681 Body mass index (BMI) 19 or less, adult: Secondary | ICD-10-CM | POA: Diagnosis not present

## 2015-03-29 DIAGNOSIS — E0590 Thyrotoxicosis, unspecified without thyrotoxic crisis or storm: Secondary | ICD-10-CM | POA: Insufficient documentation

## 2015-03-29 DIAGNOSIS — I739 Peripheral vascular disease, unspecified: Secondary | ICD-10-CM | POA: Insufficient documentation

## 2015-03-29 DIAGNOSIS — E785 Hyperlipidemia, unspecified: Secondary | ICD-10-CM | POA: Diagnosis not present

## 2015-03-29 DIAGNOSIS — R634 Abnormal weight loss: Secondary | ICD-10-CM

## 2015-03-29 DIAGNOSIS — Z79899 Other long term (current) drug therapy: Secondary | ICD-10-CM | POA: Insufficient documentation

## 2015-03-29 DIAGNOSIS — Z96652 Presence of left artificial knee joint: Secondary | ICD-10-CM | POA: Insufficient documentation

## 2015-03-29 DIAGNOSIS — K319 Disease of stomach and duodenum, unspecified: Secondary | ICD-10-CM | POA: Diagnosis not present

## 2015-03-29 HISTORY — DX: Malignant neoplasm of pancreas, unspecified: C25.9

## 2015-03-29 HISTORY — DX: Adverse effect of unspecified anesthetic, initial encounter: T41.45XA

## 2015-03-29 HISTORY — DX: Rash and other nonspecific skin eruption: R21

## 2015-03-29 HISTORY — PX: EUS: SHX5427

## 2015-03-29 HISTORY — DX: Other complications of anesthesia, initial encounter: T88.59XA

## 2015-03-29 LAB — GLUCOSE, CAPILLARY: Glucose-Capillary: 166 mg/dL — ABNORMAL HIGH (ref 65–99)

## 2015-03-29 LAB — CANCER ANTIGEN 19-9: CA 19 9: 6.9 U/mL (ref <35.0)

## 2015-03-29 SURGERY — UPPER ENDOSCOPIC ULTRASOUND (EUS) LINEAR
Anesthesia: Monitor Anesthesia Care

## 2015-03-29 MED ORDER — PROPOFOL 500 MG/50ML IV EMUL
INTRAVENOUS | Status: DC | PRN
  Administered 2015-03-29: 100 ug/kg/min via INTRAVENOUS

## 2015-03-29 MED ORDER — PROPOFOL 10 MG/ML IV BOLUS
INTRAVENOUS | Status: AC
  Filled 2015-03-29: qty 60

## 2015-03-29 MED ORDER — LACTATED RINGERS IV SOLN
INTRAVENOUS | Status: DC
  Administered 2015-03-29: 1000 mL via INTRAVENOUS

## 2015-03-29 MED ORDER — PROPOFOL 10 MG/ML IV BOLUS
INTRAVENOUS | Status: DC | PRN
  Administered 2015-03-29: 30 mg via INTRAVENOUS
  Administered 2015-03-29 (×2): 20 mg via INTRAVENOUS
  Administered 2015-03-29: 10 mg via INTRAVENOUS

## 2015-03-29 MED ORDER — SODIUM CHLORIDE 0.9 % IV SOLN: INTRAVENOUS | Status: DC

## 2015-03-29 NOTE — Telephone Encounter (Signed)
Oncology Nurse Navigator Documentation  Oncology Nurse Navigator Flowsheets 03/29/2015  Navigator Location [facility]  Navigator Encounter Type Introductory phone call  Abnormal Finding Date 03/22/2015  Barriers/Navigation Needs Coordination of Care  Spoke with patient and provided new patient appointment for 04/03/15 at 11:30/11:45 with Dr. Burr Medico. Informed of location of [facility] [place], valet service, and registration process. Reminded to bring insurance cards and a current medication list, including supplements. Patient verbalizes understanding. Mailed welcome packet to her home. Email to scheduler in radiation oncology to inquire if her appointment with Dr. Lisbeth Renshaw could be made on the same day as Dr. Ernestina Penna. Sees Dr. Barry Dienes on 04/06/15. Confirmed with patient that she prefers to come to [facility] in [place] instead of the cancer center in [place].

## 2015-03-29 NOTE — Anesthesia Postprocedure Evaluation (Signed)
Anesthesia Post Note  Patient: Jordan [name]  Procedure(s) Performed: Procedure(s) (LRB): UPPER ENDOSCOPIC ULTRASOUND (EUS) LINEAR (N/A)  Patient location during evaluation: PACU Anesthesia Type: MAC Level of consciousness: awake and alert Pain management: pain level controlled Vital Signs Assessment: post-procedure vital signs reviewed and stable Respiratory status: spontaneous breathing, nonlabored ventilation, respiratory function stable and patient connected to nasal cannula oxygen Cardiovascular status: stable and blood pressure returned to baseline Anesthetic complications: no    Last Vitals:  Filed Vitals:   03/29/15 1208 03/29/15 1216  BP:  159/58  Pulse:  63  Temp: 36.4 C   Resp:  14    Last Pain:  Filed Vitals:   03/29/15 1220  PainSc: 4                  Franca Stakes J

## 2015-03-29 NOTE — Telephone Encounter (Signed)
The patient has been notified of this information and all questions answered. Referral to med and rad onc made and those offices will contact pt directly

## 2015-03-29 NOTE — Telephone Encounter (Signed)
-----   Message from Milus Banister, MD sent at 03/29/2015 12:21 PM EST ----- She needs referral to medical oncology, radiation oncology and Dr. Barry Dienes from [facility];  Mass in pancreas body, await final cytology but suspicious for adenocarcinoma.  thanks

## 2015-03-29 NOTE — Discharge Instructions (Signed)

## 2015-03-29 NOTE — Anesthesia Preprocedure Evaluation (Addendum)
Anesthesia Evaluation  Patient identified by MRN, date of birth, ID band Patient awake    Reviewed: Allergy & Precautions, NPO status , Patient's Chart, lab work & pertinent test results  History of Anesthesia Complications (+) history of anesthetic complications  Airway Mallampati: II  TM Distance: >3 FB Neck ROM: Full    Dental no notable dental hx.    Pulmonary shortness of breath, pneumonia, COPD, Current Smoker,    Pulmonary exam normal breath sounds clear to auscultation       Cardiovascular Exercise Tolerance: Good hypertension, Pt. on medications and Pt. on home beta blockers + Peripheral Vascular Disease  Normal cardiovascular exam Rhythm:Regular Rate:Normal     Neuro/Psych PSYCHIATRIC DISORDERS negative neurological ROS     GI/Hepatic negative GI ROS, Neg liver ROS,   Endo/Other  diabetes, Type 2, Oral Hypoglycemic AgentsHyperthyroidism   Renal/GU negative Renal ROS  negative genitourinary   Musculoskeletal  (+) Arthritis ,   Abdominal   Peds negative pediatric ROS (+)  Hematology negative hematology ROS (+)   Anesthesia Other Findings   Reproductive/Obstetrics negative OB ROS                             Anesthesia Physical Anesthesia Plan  ASA: III  Anesthesia Plan: MAC   Post-op Pain Management:    Induction: Intravenous  Airway Management Planned: Natural Airway  Additional Equipment:   Intra-op Plan:   Post-operative Plan:   Informed Consent: I have reviewed the patients History and Physical, chart, labs and discussed the procedure including the risks, benefits and alternatives for the proposed anesthesia with the patient or authorized representative who has indicated his/her understanding and acceptance.   Dental advisory given  Plan Discussed with: CRNA  Anesthesia Plan Comments:         Anesthesia Quick Evaluation

## 2015-03-29 NOTE — Transfer of Care (Signed)
Immediate Anesthesia Transfer of Care Note  Patient: Jordan [name]  Procedure(s) Performed: Procedure(s): UPPER ENDOSCOPIC ULTRASOUND (EUS) LINEAR (N/A)  Patient Location: PACU and Endoscopy Unit  Anesthesia Type:MAC  Level of Consciousness: sedated and patient cooperative  Airway & Oxygen Therapy: Patient Spontanous Breathing and Patient connected to nasal cannula oxygen  Post-op Assessment: Report given to RN and Post -op Vital signs reviewed and stable  Post vital signs: Reviewed and stable  Last Vitals:  Filed Vitals:   03/29/15 1001  BP: 167/48  Pulse: 67  Temp: 36.6 C  Resp: 14    Complications: No apparent anesthesia complications

## 2015-03-29 NOTE — Telephone Encounter (Signed)
You have been scheduled for an appointment with Dr Barry Dienes at [facility]. Your appointment is on 04/06/15 at 1130 am. Please arrive at 1100 am for registration. Make certain to bring a list of current medications, including any over the counter medications or vitamins. Also bring your co-pay if you have one as well as your insurance cards. [facility] is located at [address], [address]. Should you need to reschedule your appointment, please contact them at [phone].  Left message on machine to call back

## 2015-03-29 NOTE — Op Note (Signed)
[facility] [place], [address]   ENDOSCOPIC ULTRASOUND PROCEDURE REPORT  PATIENT: Jordan [name], Jordan [name]  MR#: [id number] BIRTHDATE: 03-29-1940  GENDER: female ENDOSCOPIST: Milus Banister, MD REFERRED BY:  Burnis Medin, M.D. PROCEDURE DATE:  03/29/2015 PROCEDURE:   Upper EUS w/FNA ASA CLASS:      Class II INDICATIONS:   1.  abdominal pain, weight loss; mass in body of pancreas on CT scan. MEDICATIONS: Monitored anesthesia care  DESCRIPTION OF PROCEDURE:   After the risks benefits and alternatives of the procedure were  explained, informed consent was obtained. The patient was then placed in the left, lateral, decubitus postion and IV sedation was administered. Throughout the procedure, the patients blood pressure, pulse and oxygen saturations were monitored continuously.  Under direct visualization, the Pentax Radial EUS [id number]  endoscope was introduced through the mouth  and advanced to the second portion of the duodenum .  Water was used as necessary to provide an acoustic interface.  Upon completion of the imaging, water was removed and the patient was sent to the recovery room in satisfactory condition.  Endoscopic findings: 1. Mild portal gastropathy; no esophageal or obvious gastric varices  EUS findings: 1. Hypoechoic, heterogeneous, irregularly bordered 3cm mass in neck/body of pancreas causing upstream dilation of the main pancreatic duct. The mass directly abuts the PV/SMV at the level of the confluence with the splenic vein.  There is no clear involvement with SMA or celiac trunk.  The mass was sampled with 5 transgastric passes with EUS FNA needle (3 with 22 acquire needle, 2 with 25 gauge standard needle). 2. The main pancreatic duct is dilated to 63mm in body and tail (proximal to the mass above). 3. No peripancreatic adenopathy. 4. CBD was normal, non-dilated 5. Limited views of liver were normal.  ENDOSCOPIC  IMPRESSION: 3cm mass in pancreatic neck/body that directly abuts the PV/SMV at level on confluence with splenic vein. The mass is causing obstruction/dilation of the main pancreatic duct and it was sampled with EUS FNA.  Await final cytology review.  RECOMMENDATIONS: Await final cytology review.  Will start referral to medical, radiation oncology as well as Dr. Barry Dienes from [facility].  _______________________________ eSignedMilus Banister, MD 03/29/2015 12:20 PM

## 2015-03-29 NOTE — Interval H&P Note (Signed)
History and Physical Interval Note:  03/29/2015 10:42 AM  Jordan [name]  has presented today for surgery, with the diagnosis of pancreatic mass  The various methods of treatment have been discussed with the patient and family. After consideration of risks, benefits and other options for treatment, the patient has consented to  Procedure(s): UPPER ENDOSCOPIC ULTRASOUND (EUS) LINEAR (N/A) as a surgical intervention .  The patient's history has been reviewed, patient examined, no change in status, stable for surgery.  I have reviewed the patient's chart and labs.  Questions were answered to the patient's satisfaction.     Milus Banister
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
## 2015-03-30 ENCOUNTER — Encounter (HOSPITAL_COMMUNITY): Payer: Self-pay | Admitting: Gastroenterology

## 2015-03-31 ENCOUNTER — Encounter: Payer: Self-pay | Admitting: Internal Medicine

## 2015-04-02 ENCOUNTER — Ambulatory Visit
Admission: RE | Admit: 2015-04-02 | Discharge: 2015-04-02 | Disposition: A | Discharge status: Home / Self Care | Payer: PPO | Source: Ambulatory Visit | Attending: Radiation Oncology | Admitting: Radiation Oncology

## 2015-04-02 ENCOUNTER — Encounter: Payer: Self-pay | Admitting: Radiation Oncology

## 2015-04-02 VITALS — BP 173/52 | HR 69 | Temp 97.7°F | Resp 20 | Ht 66.0 in | Wt 109.7 lb

## 2015-04-02 DIAGNOSIS — E119 Type 2 diabetes mellitus without complications: Secondary | ICD-10-CM | POA: Insufficient documentation

## 2015-04-02 DIAGNOSIS — Z9049 Acquired absence of other specified parts of digestive tract: Secondary | ICD-10-CM | POA: Insufficient documentation

## 2015-04-02 DIAGNOSIS — J449 Chronic obstructive pulmonary disease, unspecified: Secondary | ICD-10-CM | POA: Diagnosis not present

## 2015-04-02 DIAGNOSIS — I10 Essential (primary) hypertension: Secondary | ICD-10-CM | POA: Diagnosis not present

## 2015-04-02 DIAGNOSIS — N3941 Urge incontinence: Secondary | ICD-10-CM | POA: Insufficient documentation

## 2015-04-02 DIAGNOSIS — I6529 Occlusion and stenosis of unspecified carotid artery: Secondary | ICD-10-CM | POA: Insufficient documentation

## 2015-04-02 DIAGNOSIS — C251 Malignant neoplasm of body of pancreas: Secondary | ICD-10-CM

## 2015-04-02 DIAGNOSIS — R600 Localized edema: Secondary | ICD-10-CM | POA: Diagnosis not present

## 2015-04-02 DIAGNOSIS — I7789 Other specified disorders of arteries and arterioles: Secondary | ICD-10-CM | POA: Insufficient documentation

## 2015-04-02 DIAGNOSIS — I739 Peripheral vascular disease, unspecified: Secondary | ICD-10-CM | POA: Diagnosis not present

## 2015-04-02 DIAGNOSIS — E0590 Thyrotoxicosis, unspecified without thyrotoxic crisis or storm: Secondary | ICD-10-CM | POA: Diagnosis not present

## 2015-04-02 DIAGNOSIS — E785 Hyperlipidemia, unspecified: Secondary | ICD-10-CM | POA: Insufficient documentation

## 2015-04-02 HISTORY — DX: Allergy, unspecified, initial encounter: T78.40XA

## 2015-04-02 HISTORY — DX: Malignant neoplasm of pancreas, unspecified: C25.9

## 2015-04-02 NOTE — Progress Notes (Signed)
Please see the Nurse Progress Note in the MD Initial Consult Encounter for this patient. 

## 2015-04-02 NOTE — Progress Notes (Signed)
GI Location of Tumor / Histology: Pancreas  3cm mass neck/body   Jordan [name] presented  months ago with symptoms of: Epigastric pain and weight loss  Biopsies of  (if applicable) revealed:::Diagnosis 03/29/2015: FINE NEEDLE ASPIRATION, ENDOSCOPIC, PANCREAS, BODY AND NECK(SPECIMEN 1 OF 1 COLLECTED 03/29/15): ATYPICAL CELLS PRESENT, SEE COMMENT  Past/Anticipated interventions by surgeon, if any: Dr. Shela Commons 03/29/15  Past/Anticipated interventions by medical oncology, if any: appt with Dr. Burr Medico 04/03/2015  Weight changes, if any: 40 lb wt.loss in last year   Bowel/Bladder complaints, if any: Regular bowels  And bladder no bleeding    Nausea / Vomiting, if any: NO  Pain issues, if any: NO  Any blood per rectum: NO   Regular bowels  And bladder no bleeding  SAFETY ISSUES: NO  Prior radiation? NO  Pacemaker/ICD?  NO   Possible current pregnancy? NO  Is the patient on methotrexate? NO  Current Complaints/Details:curent smoker cigarettes  1/2ppd x 50 years, COPD, DM, , drinks alcohol, no smokeless tobacco or illicit drugs Mother and brother hx colon polyps, father heart disease  Allergies: prednisone-N& V high BP 173/52 mmHg  Pulse 69  Temp(Src) 97.7 F (36.5 C) (Oral)  Resp 20  Ht 5\' 6"  (1.676 m)  Wt 109 lb 11.4 oz (49.764 kg)  BMI 17.72 kg/m2  SpO2 99%  Wt Readings from Last 3 Encounters:  04/02/15 109 lb 11.4 oz (49.764 kg)  03/29/15 106 lb (48.081 kg)  03/28/15 109 lb 4 oz (49.555 kg)
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
## 2015-04-03 ENCOUNTER — Encounter: Payer: Self-pay | Admitting: *Deleted

## 2015-04-03 ENCOUNTER — Telehealth: Payer: Self-pay

## 2015-04-03 ENCOUNTER — Telehealth: Payer: Self-pay | Admitting: Hematology

## 2015-04-03 ENCOUNTER — Encounter: Payer: Self-pay | Admitting: Hematology

## 2015-04-03 ENCOUNTER — Other Ambulatory Visit: Payer: Self-pay

## 2015-04-03 ENCOUNTER — Ambulatory Visit (HOSPITAL_BASED_OUTPATIENT_CLINIC_OR_DEPARTMENT_OTHER): Payer: PPO | Admitting: Hematology

## 2015-04-03 ENCOUNTER — Encounter: Payer: Self-pay | Admitting: Radiation Oncology

## 2015-04-03 VITALS — BP 164/54 | HR 74 | Temp 97.7°F | Resp 18 | Ht 66.0 in | Wt 110.8 lb

## 2015-04-03 DIAGNOSIS — K869 Disease of pancreas, unspecified: Secondary | ICD-10-CM

## 2015-04-03 DIAGNOSIS — R1013 Epigastric pain: Secondary | ICD-10-CM

## 2015-04-03 DIAGNOSIS — K8689 Other specified diseases of pancreas: Secondary | ICD-10-CM

## 2015-04-03 DIAGNOSIS — E039 Hypothyroidism, unspecified: Secondary | ICD-10-CM | POA: Diagnosis not present

## 2015-04-03 DIAGNOSIS — Z720 Tobacco use: Secondary | ICD-10-CM | POA: Diagnosis not present

## 2015-04-03 DIAGNOSIS — C251 Malignant neoplasm of body of pancreas: Secondary | ICD-10-CM

## 2015-04-03 NOTE — Telephone Encounter (Signed)
per pof to sch CT scan-adv pt cemtral Sch will call to sch scan

## 2015-04-03 NOTE — Progress Notes (Signed)
Oncology Nurse Navigator Documentation  Oncology Nurse Navigator Flowsheets 04/03/2015  Navigator Location [facility]  Navigator Encounter Type Initial MedOnc  Abnormal Finding Date 03/22/2015  Patient Visit Type MedOnc;Initial  Treatment Phase Abnormal Scans  Barriers/Navigation Needs Family concerns;Education  Education Understanding Cancer/ Treatment Options;Coping with Diagnosis/ Prognosis;Newly Diagnosed Cancer Education;Preparing for Upcoming Biopsy/ Treatment  Interventions Education Method  Education Method Written;Verbal;Teach-back  Support Groups/Services GI Support Group  Acuity Level 1  Time Spent with Patient 60  Met with patient during new patient visit. Explained the role of the GI Nurse Navigator and provided New Patient Packet with information on: 1. Pancreas cancer--difference in treatment for adenocarcinoma vs NET 2. Support groups 3. Advanced Directives 4. Fall Safety Plan Answered questions, reviewed current treatment plan using TEACH back and provided emotional support. Provided copy of current treatment plan. Offered to have dietician see her and she declined saying " I already saw one that told me to eat 3 meals/day and 2 snacks and I can't do it". Corrected this and told her to eat 6 small snacks per day and try an Ensure or Boost at bedtime. Also that dietician can help her find foods that will add more calories-she declines. Very frustrated that she has seen so many physicians and still does not have a diagnosis. Allowed her to vent and agreed that this must be very frustrating and scary for her also. 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  Merceda Elks, RN, BSN GI Oncology [place]

## 2015-04-03 NOTE — Telephone Encounter (Signed)
Per Dr Ardis Hughs verbal order IR does NOT think the can safely biopsy the pancreatic mass so he wants to do EUS next week.  Pt has been scheduled for Monday 04/09/15 730 am.  Pt has been instructed and Sharee Pimple notified at [facility]

## 2015-04-04 ENCOUNTER — Encounter: Payer: Self-pay | Admitting: Hematology

## 2015-04-04 ENCOUNTER — Telehealth: Payer: Self-pay | Admitting: *Deleted

## 2015-04-04 ENCOUNTER — Encounter (INDEPENDENT_AMBULATORY_CARE_PROVIDER_SITE_OTHER): Payer: PPO | Admitting: Ophthalmology

## 2015-04-04 ENCOUNTER — Encounter (HOSPITAL_COMMUNITY): Payer: Self-pay | Admitting: *Deleted

## 2015-04-04 DIAGNOSIS — E113292 Type 2 diabetes mellitus with mild nonproliferative diabetic retinopathy without macular edema, left eye: Secondary | ICD-10-CM

## 2015-04-04 DIAGNOSIS — E11311 Type 2 diabetes mellitus with unspecified diabetic retinopathy with macular edema: Secondary | ICD-10-CM

## 2015-04-04 DIAGNOSIS — H353122 Nonexudative age-related macular degeneration, left eye, intermediate dry stage: Secondary | ICD-10-CM | POA: Diagnosis not present

## 2015-04-04 DIAGNOSIS — H43813 Vitreous degeneration, bilateral: Secondary | ICD-10-CM | POA: Diagnosis not present

## 2015-04-04 DIAGNOSIS — I10 Essential (primary) hypertension: Secondary | ICD-10-CM | POA: Diagnosis not present

## 2015-04-04 DIAGNOSIS — E113311 Type 2 diabetes mellitus with moderate nonproliferative diabetic retinopathy with macular edema, right eye: Secondary | ICD-10-CM | POA: Diagnosis not present

## 2015-04-04 DIAGNOSIS — H35033 Hypertensive retinopathy, bilateral: Secondary | ICD-10-CM | POA: Diagnosis not present

## 2015-04-04 DIAGNOSIS — H353211 Exudative age-related macular degeneration, right eye, with active choroidal neovascularization: Secondary | ICD-10-CM | POA: Diagnosis not present

## 2015-04-04 NOTE — Telephone Encounter (Signed)
  Oncology Nurse Navigator Documentation  Navigator Location: [facility] (04/04/15 1636) Navigator Encounter Type: Telephone (04/04/15 1636) Telephone: Incoming Call (04/04/15 1636)  Patient call to inquire why she needs CT chest since she had a CT recently that saw her lungs and she has also had #2 CXR recently? Explained that her prior CT was Abd/Pelvis and did capture the bases of her lungs only, so entire lung field and mediastinum were not seen. Informed her that a CT scan can see lymph nodes and cancer better than a CXR and even before it can be seen on CXR.  She expresses her frustration with so many tests and no answers yet. Acknowledged that I'm sure this is very difficult and stressful. Informed her that Dr. Burr Medico will be calling her today with the tumor board discussion.

## 2015-04-06 ENCOUNTER — Telehealth: Payer: Self-pay | Admitting: *Deleted

## 2015-04-06 ENCOUNTER — Telehealth: Payer: Self-pay | Admitting: Hematology

## 2015-04-06 NOTE — Telephone Encounter (Signed)
  Oncology Nurse Navigator Documentation  Navigator Location: [facility] (04/06/15 1203) Navigator Encounter Type: Telephone (04/06/15 1203) Telephone: Lahoma Crocker Call;Appt Confirmation/Clarification (04/06/15 1203)  Called and informed her of CT chest and Abdomen w/pancreas protocol are on 04/10/15 at 11:00 at [facility]. Arrive at 11:15. Instructed her to be NPO 4 hours prior and she will not need oral contrast per radiology-she will be drinking water in radiology for the scan. Confirmed with Manuela Schwartz in central scheduling that the chest/abdomen will be done on 04/10/15. Patient has appointment w/Dr. Barry Dienes on 04/16/15-she is asking if Dr. Burr Medico still wants to see her next week or wait till after she sees the surgeon? Will discuss w/MD>

## 2015-04-06 NOTE — Telephone Encounter (Signed)
Spk with pt confirming MD visit per 01/27 POF, mailed out schedule to pt... KJ

## 2015-04-06 NOTE — Telephone Encounter (Signed)
Email from PA department that CT is approved w/ Josem Kaufmann [id number]

## 2015-04-06 NOTE — Telephone Encounter (Signed)
  Oncology Nurse Navigator Documentation  Navigator Location: [facility] (04/06/15 1645) Navigator Encounter Type: Telephone (04/06/15 1645) Telephone: Lahoma Crocker Call;Appt Confirmation/Clarification (04/06/15 1645)  Informed patient that Dr. Burr Medico will see her before or after surgeon appointment-patient's choice. She prefers after seeing Dr. Barry Dienes and accepted appointment on 04/16/15 at 3:15. Her appointment w/Dr. Barry Dienes is at 11:30.

## 2015-04-09 ENCOUNTER — Encounter (HOSPITAL_COMMUNITY)
Admission: RE | Disposition: A | Discharge status: Home / Self Care | Payer: Self-pay | Source: Ambulatory Visit | Attending: Gastroenterology

## 2015-04-09 ENCOUNTER — Ambulatory Visit (HOSPITAL_COMMUNITY): Payer: PPO | Admitting: Anesthesiology

## 2015-04-09 ENCOUNTER — Ambulatory Visit (HOSPITAL_COMMUNITY)
Admission: RE | Admit: 2015-04-09 | Discharge: 2015-04-09 | Disposition: A | Discharge status: Home / Self Care | Payer: PPO | Source: Ambulatory Visit | Attending: Gastroenterology | Admitting: Gastroenterology

## 2015-04-09 ENCOUNTER — Encounter: Payer: Self-pay | Admitting: Internal Medicine

## 2015-04-09 ENCOUNTER — Encounter (HOSPITAL_COMMUNITY): Payer: Self-pay | Admitting: *Deleted

## 2015-04-09 DIAGNOSIS — Z9049 Acquired absence of other specified parts of digestive tract: Secondary | ICD-10-CM | POA: Diagnosis not present

## 2015-04-09 DIAGNOSIS — K8689 Other specified diseases of pancreas: Secondary | ICD-10-CM

## 2015-04-09 DIAGNOSIS — Z7982 Long term (current) use of aspirin: Secondary | ICD-10-CM | POA: Diagnosis not present

## 2015-04-09 DIAGNOSIS — M1990 Unspecified osteoarthritis, unspecified site: Secondary | ICD-10-CM | POA: Insufficient documentation

## 2015-04-09 DIAGNOSIS — C251 Malignant neoplasm of body of pancreas: Secondary | ICD-10-CM | POA: Diagnosis not present

## 2015-04-09 DIAGNOSIS — I6529 Occlusion and stenosis of unspecified carotid artery: Secondary | ICD-10-CM | POA: Diagnosis not present

## 2015-04-09 DIAGNOSIS — E785 Hyperlipidemia, unspecified: Secondary | ICD-10-CM | POA: Diagnosis not present

## 2015-04-09 DIAGNOSIS — K869 Disease of pancreas, unspecified: Secondary | ICD-10-CM | POA: Diagnosis not present

## 2015-04-09 DIAGNOSIS — F17210 Nicotine dependence, cigarettes, uncomplicated: Secondary | ICD-10-CM | POA: Diagnosis not present

## 2015-04-09 DIAGNOSIS — J449 Chronic obstructive pulmonary disease, unspecified: Secondary | ICD-10-CM | POA: Insufficient documentation

## 2015-04-09 DIAGNOSIS — I739 Peripheral vascular disease, unspecified: Secondary | ICD-10-CM | POA: Diagnosis not present

## 2015-04-09 DIAGNOSIS — Z7984 Long term (current) use of oral hypoglycemic drugs: Secondary | ICD-10-CM | POA: Diagnosis not present

## 2015-04-09 DIAGNOSIS — Z79899 Other long term (current) drug therapy: Secondary | ICD-10-CM | POA: Insufficient documentation

## 2015-04-09 DIAGNOSIS — E119 Type 2 diabetes mellitus without complications: Secondary | ICD-10-CM | POA: Insufficient documentation

## 2015-04-09 DIAGNOSIS — Z96652 Presence of left artificial knee joint: Secondary | ICD-10-CM | POA: Diagnosis not present

## 2015-04-09 DIAGNOSIS — I10 Essential (primary) hypertension: Secondary | ICD-10-CM | POA: Diagnosis not present

## 2015-04-09 HISTORY — PX: EUS: SHX5427

## 2015-04-09 SURGERY — UPPER ENDOSCOPIC ULTRASOUND (EUS) LINEAR
Anesthesia: Monitor Anesthesia Care

## 2015-04-09 MED ORDER — PROPOFOL 10 MG/ML IV BOLUS
INTRAVENOUS | Status: AC
  Filled 2015-04-09: qty 20

## 2015-04-09 MED ORDER — SODIUM CHLORIDE 0.9 % IV SOLN: INTRAVENOUS | Status: DC

## 2015-04-09 MED ORDER — PROPOFOL 10 MG/ML IV BOLUS
INTRAVENOUS | Status: DC | PRN
  Administered 2015-04-09: 10 mg via INTRAVENOUS
  Administered 2015-04-09: 20 mg via INTRAVENOUS
  Administered 2015-04-09 (×3): 10 mg via INTRAVENOUS
  Administered 2015-04-09: 20 mg via INTRAVENOUS
  Administered 2015-04-09 (×6): 10 mg via INTRAVENOUS
  Administered 2015-04-09: 20 mg via INTRAVENOUS
  Administered 2015-04-09 (×9): 10 mg via INTRAVENOUS
  Administered 2015-04-09: 20 mg via INTRAVENOUS
  Administered 2015-04-09: 10 mg via INTRAVENOUS
  Administered 2015-04-09: 20 mg via INTRAVENOUS
  Administered 2015-04-09 (×3): 10 mg via INTRAVENOUS
  Administered 2015-04-09: 30 mg via INTRAVENOUS
  Administered 2015-04-09 (×2): 10 mg via INTRAVENOUS
  Administered 2015-04-09: 20 mg via INTRAVENOUS

## 2015-04-09 MED ORDER — PROPOFOL 10 MG/ML IV BOLUS
INTRAVENOUS | Status: AC
  Filled 2015-04-09: qty 40

## 2015-04-09 MED ORDER — LACTATED RINGERS IV SOLN
INTRAVENOUS | Status: DC
  Administered 2015-04-09: 09:00:00 via INTRAVENOUS

## 2015-04-09 NOTE — Transfer of Care (Signed)
Immediate Anesthesia Transfer of Care Note  Patient: Jordan [name]  Procedure(s) Performed: Procedure(s): UPPER ENDOSCOPIC ULTRASOUND (EUS) LINEAR (N/A)  Patient Location: PACU  Anesthesia Type:MAC  Level of Consciousness: sedated  Airway & Oxygen Therapy: Patient Spontanous Breathing and Patient connected to nasal cannula oxygen  Post-op Assessment: Report given to RN and Post -op Vital signs reviewed and stable  Post vital signs: Reviewed and stable  Last Vitals:  Filed Vitals:   04/09/15 0654  BP: 165/50  Pulse: 80  Temp: 36.7 C  Resp: 15    Complications: No apparent anesthesia complications
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
## 2015-04-09 NOTE — Anesthesia Preprocedure Evaluation (Signed)
Anesthesia Evaluation  Patient identified by MRN, date of birth, ID band Patient awake    Reviewed: Allergy & Precautions, NPO status , Patient's Chart, lab work & pertinent test results  History of Anesthesia Complications (+) PONV and history of anesthetic complications  Airway Mallampati: II  TM Distance: >3 FB Neck ROM: Full    Dental no notable dental hx. (+) Partial Upper, Dental Advisory Given   Pulmonary COPD, Current Smoker,    Pulmonary exam normal breath sounds clear to auscultation       Cardiovascular hypertension, Pt. on medications and Pt. on home beta blockers Normal cardiovascular exam Rhythm:Regular Rate:Normal     Neuro/Psych negative neurological ROS  negative psych ROS   GI/Hepatic negative GI ROS, Neg liver ROS,   Endo/Other  diabetes, Oral Hypoglycemic AgentsHyperthyroidism   Renal/GU negative Renal ROS  negative genitourinary   Musculoskeletal  (+) Arthritis ,   Abdominal   Peds negative pediatric ROS (+)  Hematology negative hematology ROS (+)   Anesthesia Other Findings Pancreatic ca  Reproductive/Obstetrics negative OB ROS                             Anesthesia Physical Anesthesia Plan  ASA: III  Anesthesia Plan: MAC   Post-op Pain Management:    Induction: Intravenous  Airway Management Planned: Nasal Cannula  Additional Equipment:   Intra-op Plan:   Post-operative Plan:   Informed Consent: I have reviewed the patients History and Physical, chart, labs and discussed the procedure including the risks, benefits and alternatives for the proposed anesthesia with the patient or authorized representative who has indicated his/her understanding and acceptance.   Dental advisory given  Plan Discussed with: CRNA  Anesthesia Plan Comments:         Anesthesia Quick Evaluation

## 2015-04-09 NOTE — Interval H&P Note (Signed)
History and Physical Interval Note:  04/09/2015 9:19 AM  Jordan [name]  has presented today for surgery, with the diagnosis of panc mass  The various methods of treatment have been discussed with the patient and family. After consideration of risks, benefits and other options for treatment, the patient has consented to  Procedure(s): UPPER ENDOSCOPIC ULTRASOUND (EUS) LINEAR (N/A) as a surgical intervention .  The patient's history has been reviewed, patient examined, no change in status, stable for surgery.  I have reviewed the patient's chart and labs.  Questions were answered to the patient's satisfaction.     Milus Banister

## 2015-04-09 NOTE — Op Note (Signed)
[place], [address]   ENDOSCOPIC ULTRASOUND PROCEDURE REPORT  PATIENT: Jordan [name], Jordan [name]  MR#: [id number] BIRTHDATE: [birth year]  GENDER: female ENDOSCOPIST: Milus Banister, MD PROCEDURE DATE:  04/09/2015 PROCEDURE:   Upper EUS w/FNA ASA CLASS:      Class II INDICATIONS:   1.  pancreatic body/tail mass, weight loss; EUS FNA two weeks ago with atypical cells; here for repeat EUS FNA. MEDICATIONS: Monitored anesthesia care  DESCRIPTION OF PROCEDURE:   After the risks benefits and alternatives of the procedure were  explained, informed consent was obtained. The patient was then placed in the left, lateral, decubitus postion and IV sedation was administered. Throughout the procedure, the patients blood pressure, pulse and oxygen saturations were monitored continuously.  Under direct visualization, the EUS scope  endoscope was introduced through the mouth  and advanced to the bulb of duodenum .  Water was used as necessary to provide an acoustic interface.  Upon completion of the imaging, water was removed and the patient was sent to the recovery room in satisfactory condition.    Endoscopic findings (limited exam with linear echoendoscope only): 1. Normal UGI tract to duodenal bulb  EUS findings (limited exam for tissue acquisition): 1. There was a 3cm hypoechoic pancreatic body mass causing main pancreatic duct obstruction was again noted.  The mass was sampled with 4 trangastric passes with a 25 guage EUS FNA needle, suction.   ENDOSCOPIC IMPRESSION: 3cm hypoechoic mass in pancreatic body/neck region causing main pancreatic duct obstruction. See previous EUS note detailing vascular involvement.  Preliminary cytology review today is positive for malignancy (adenocarcinoma).  RECOMMENDATIONS: I will communicate these results and final report with medical and surgical oncology.  _______________________________ eSignedMilus Banister, MD 04/09/2015 10:37 AM   CC:Dr. Suzanna Obey, MD; Dr. Truitt Merle

## 2015-04-09 NOTE — Discharge Instructions (Signed)

## 2015-04-09 NOTE — Anesthesia Postprocedure Evaluation (Signed)
Anesthesia Post Note  Patient: MARSIE KRAYNAK  Procedure(s) Performed: Procedure(s) (LRB): UPPER ENDOSCOPIC ULTRASOUND (EUS) LINEAR (N/A)  Patient location during evaluation: PACU Anesthesia Type: MAC Level of consciousness: awake and alert Pain management: pain level controlled Vital Signs Assessment: post-procedure vital signs reviewed and stable Respiratory status: spontaneous breathing, nonlabored ventilation, respiratory function stable and patient connected to nasal cannula oxygen Cardiovascular status: blood pressure returned to baseline and stable Postop Assessment: no signs of nausea or vomiting Anesthetic complications: no    Last Vitals:  Filed Vitals:   04/09/15 0654 04/09/15 1037  BP: 165/50 116/37  Pulse: 80   Temp: 36.7 C 36.6 C  Resp: 15 20    Last Pain: There were no vitals filed for this visit.               Haneefah Venturini JENNETTE

## 2015-04-10 ENCOUNTER — Ambulatory Visit (HOSPITAL_COMMUNITY)
Admission: RE | Admit: 2015-04-10 | Discharge: 2015-04-10 | Disposition: A | Discharge status: Home / Self Care | Payer: PPO | Source: Ambulatory Visit | Attending: Hematology | Admitting: Hematology

## 2015-04-10 ENCOUNTER — Encounter (HOSPITAL_COMMUNITY): Payer: Self-pay | Admitting: Gastroenterology

## 2015-04-10 ENCOUNTER — Other Ambulatory Visit: Payer: Self-pay | Admitting: Hematology

## 2015-04-10 DIAGNOSIS — K7689 Other specified diseases of liver: Secondary | ICD-10-CM | POA: Diagnosis not present

## 2015-04-10 DIAGNOSIS — I2510 Atherosclerotic heart disease of native coronary artery without angina pectoris: Secondary | ICD-10-CM | POA: Insufficient documentation

## 2015-04-10 DIAGNOSIS — K929 Disease of digestive system, unspecified: Secondary | ICD-10-CM | POA: Insufficient documentation

## 2015-04-10 DIAGNOSIS — R918 Other nonspecific abnormal finding of lung field: Secondary | ICD-10-CM | POA: Diagnosis not present

## 2015-04-10 DIAGNOSIS — C251 Malignant neoplasm of body of pancreas: Secondary | ICD-10-CM

## 2015-04-10 DIAGNOSIS — R160 Hepatomegaly, not elsewhere classified: Secondary | ICD-10-CM | POA: Insufficient documentation

## 2015-04-10 MED ORDER — IOHEXOL 300 MG/ML  SOLN
100.0000 mL | Freq: Once | INTRAMUSCULAR | Status: AC | PRN
  Administered 2015-04-10: 100 mL via INTRAVENOUS

## 2015-04-11 ENCOUNTER — Telehealth: Payer: Self-pay | Admitting: Gastroenterology

## 2015-04-11 ENCOUNTER — Telehealth: Payer: Self-pay | Admitting: *Deleted

## 2015-04-11 NOTE — Telephone Encounter (Signed)
We discussed the path

## 2015-04-11 NOTE — Telephone Encounter (Signed)
Her oncology appointment is 04/16/15. She was offered an earlier appointment but declined due to her own schedule. The biopsy results are in.

## 2015-04-11 NOTE — Telephone Encounter (Signed)
Called patient offer her to see Dr. Barry Dienes on 04/13/15 at 10:00 followed by Dr. Burr Medico at 11:15. She declines the appointment-prefers Monday as scheduled due to meeting she can't miss on Friday. Appreciates the offer.

## 2015-04-12 ENCOUNTER — Other Ambulatory Visit: Payer: Self-pay | Admitting: Hematology

## 2015-04-12 ENCOUNTER — Telehealth: Payer: Self-pay | Admitting: Hematology

## 2015-04-12 ENCOUNTER — Ambulatory Visit: Payer: PPO | Admitting: Hematology

## 2015-04-12 NOTE — Telephone Encounter (Signed)
per pof to sch pt appt-per pof pt aware °

## 2015-04-13 ENCOUNTER — Encounter: Payer: Self-pay | Admitting: Hematology

## 2015-04-13 ENCOUNTER — Ambulatory Visit (HOSPITAL_BASED_OUTPATIENT_CLINIC_OR_DEPARTMENT_OTHER): Payer: PPO | Admitting: Hematology

## 2015-04-13 ENCOUNTER — Encounter: Payer: Self-pay | Admitting: *Deleted

## 2015-04-13 VITALS — BP 177/60 | HR 70 | Temp 97.9°F | Resp 17 | Ht 66.0 in | Wt 107.0 lb

## 2015-04-13 DIAGNOSIS — E039 Hypothyroidism, unspecified: Secondary | ICD-10-CM

## 2015-04-13 DIAGNOSIS — Z720 Tobacco use: Secondary | ICD-10-CM | POA: Diagnosis not present

## 2015-04-13 DIAGNOSIS — C251 Malignant neoplasm of body of pancreas: Secondary | ICD-10-CM | POA: Diagnosis not present

## 2015-04-13 NOTE — Progress Notes (Signed)
Oncology Nurse Navigator Documentation  Oncology Nurse Navigator Flowsheets 04/13/2015  Navigator Location [facility]  Navigator Encounter Type Follow-up Appt  Telephone -  Abnormal Finding Date 03/22/2015  Confirmed Diagnosis Date 04/09/2015  Patient Visit Type MedOnc  Treatment Phase Pre-Tx/Tx Discussion--chemo vs no chemo  Barriers/Navigation Needs Education;Family concerns  Education Coping with Diagnosis/ Prognosis;Preparing for Upcoming Biopsy/ Treatment  Interventions Education Method-explained biopsy procedure and why important to r/o liver mets  Education Method Verbal  Support Groups/Services -  Acuity Level 2  Time Spent with Patient 30  Reinforced that she will not have surgery if area on liver is positive for pancreas mets; without treatment median survival is 4 months and with chemo can extend out more months up to a year (per MD). She is anxious to have biopsy soon-will accept any time next week.

## 2015-04-13 NOTE — Progress Notes (Signed)
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 biopsy, she agrees. I'll arrange biopsy through interventional radiology. -I discussed the treatment options for metastatic pancreatic cancer. Unfortunately if the liver biopsy confirms metastasis, this would be incurable disease, and surgery will not be offered. I wouldn't recommend systemic chemotherapy, to prolong her life and treat cancer-related symptoms.   2. DM, hypothyroidism, COPD, PVD, arthritis -she will continue followup with her PCP  3. Smoking -She is still actively smoking, we discussed smoking cessation. -I strongly encouraged her to stop smoking, she was not very interested.   Plan for today -IR liver mass biopsy, hopefully next week  -I will see her back in a few days after the biopsy, to finalize her treatment plan    All questions were answered. The patient knows to call the clinic with any problems, questions or concerns.  I spent 25 minutes counseling the patient face to face. The total time spent in the appointment was 30 minutes and more than 50% was on counseling.     FTruitt Merle MD 04/13/2015

## 2015-04-16 ENCOUNTER — Telehealth: Payer: Self-pay | Admitting: *Deleted

## 2015-04-16 ENCOUNTER — Ambulatory Visit: Payer: PPO | Admitting: Hematology

## 2015-04-16 NOTE — Addendum Note (Signed)
Addended by: Truitt Merle on: 04/16/2015 05:16 PM   Modules accepted: Orders

## 2015-04-16 NOTE — Telephone Encounter (Signed)
Left VM to follow up on status of biopsy appointment for liver.

## 2015-04-16 NOTE — Telephone Encounter (Signed)
"  I'm expecting a call from Dr. Burr Medico today.  Can she call me before 11:00 or after 4:00 as I have an appointment in [facility] today."

## 2015-04-17 ENCOUNTER — Other Ambulatory Visit: Payer: Self-pay | Admitting: Hematology

## 2015-04-17 ENCOUNTER — Ambulatory Visit (HOSPITAL_BASED_OUTPATIENT_CLINIC_OR_DEPARTMENT_OTHER): Payer: PPO | Admitting: Hematology

## 2015-04-17 ENCOUNTER — Telehealth: Payer: Self-pay | Admitting: Hematology

## 2015-04-17 VITALS — BP 151/58 | HR 67 | Temp 98.0°F | Resp 18 | Ht 66.0 in | Wt 108.9 lb

## 2015-04-17 DIAGNOSIS — Z720 Tobacco use: Secondary | ICD-10-CM

## 2015-04-17 DIAGNOSIS — R1013 Epigastric pain: Secondary | ICD-10-CM

## 2015-04-17 DIAGNOSIS — C251 Malignant neoplasm of body of pancreas: Secondary | ICD-10-CM | POA: Diagnosis not present

## 2015-04-17 DIAGNOSIS — E039 Hypothyroidism, unspecified: Secondary | ICD-10-CM

## 2015-04-17 MED ORDER — TRAMADOL HCL 50 MG PO TABS: 50.0000 mg | ORAL_TABLET | Freq: Four times a day (QID) | ORAL | Status: AC | PRN

## 2015-04-17 NOTE — Telephone Encounter (Signed)
MD visit added per 02/07 POF, pt is aware..... KJ

## 2015-04-18 ENCOUNTER — Telehealth: Payer: Self-pay

## 2015-04-18 ENCOUNTER — Telehealth: Payer: Self-pay | Admitting: *Deleted

## 2015-04-18 ENCOUNTER — Encounter: Payer: Self-pay | Admitting: Hematology

## 2015-04-18 NOTE — Telephone Encounter (Signed)
Pt called to let Dr Burr Medico know her final decision is to receive no treatment.

## 2015-04-18 NOTE — Telephone Encounter (Signed)
Oncology Nurse Navigator Documentation  Oncology Nurse Navigator Flowsheets 04/18/2015  Navigator Location [facility]  Navigator Encounter Type Telephone  Telephone Outgoing Call;Patient Update  Abnormal Finding Date -  Confirmed Diagnosis Date -  Patient Visit Type -  Treatment Phase Other--patient has decided on supportive/palliative care only  Barriers/Navigation Needs -Made her aware that Dr. Burr Medico saw her note and respects her decision. She is wanting to offer appointment in a month to f/u on her status. We can help her with symptom management and end of life care when appropriate. She appreciates this and agrees to see MD in March. Scheduled for 05/18/15 since she is in [place] that day seeing her PCP.  Education -  Interventions -  Education Method -  Support Groups/Services -  Acuity -  Time Spent with Patient 15

## 2015-04-18 NOTE — Telephone Encounter (Signed)
Jordan [name],  Please let her know that I received her message, and respect her decision, will still be happy to see her for follow up and symptoms management if she wishes. If she agrees, schedule her to see me in one month.   Thanks.   Truitt Merle  04/18/2015

## 2015-04-18 NOTE — Progress Notes (Signed)
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 causing obstruction/dilation of the main pancreatic duct and it was sampled with EUS FNA. Await final cytology review.   ASSESSMENT & PLAN:  75year old Caucasian female, presented with epigastric and back pain, and 40 pounds weight loss.  1. 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 No definitive evidence of metsastasis in lungs, but the 2.4cm liver lesion is highly suspicious for metastasis. -I spoke with Dr. WPascal Luxyesterday about her liver mass biopsy. Dr. WPascal Luxfeels her liver lesion is most consistent with metastasis. Due to the location, it will be difficult to biopsy. Liver MRI may be helpful to confirm the CT scan findings. -I discussed the above with patient, she is reluctant to have more biopsy or scan, and will discuss with her family members. -We again reviewed the treatment options. During the most likely liver metastasis, I do not think that the BBarry Dieneswill offer her surgery. I recommend her to start systemic chemotherapy with single agent weekly gemcitabine, or  gemcitabine and Abraxane. -Patient is also thinking about [facility] I told her I can help her to set  up the referral. -We again reviewed the natural history of metastatic pancreas cancer, incurable nature and poor prognosis overall. Patient understands the media survival is about 4-5 months without treatment, and chemotherapy may potentially extend her life for a few months to several months. -Alternatively, if she opts no treatment, supportive care along would be reasonable.  -Patient is the caregiver of her husband, who came in with her today in a wheelchair. They have no children, limited support at home  -Patient was sick about the above options again, and a call me back in a few days about her final decision.  2. Abdominal pain and weight loss -Her pain is mild to mederate, tolerable, she is not taking any pain meds -I give her a prescription of tramadol today, in case her pain gets worse. -I encouraged her to take a nutritional supplement   3. DM, hypothyroidism, COPD, PVD, arthritis -she will continue followup with her PCP  4. Smoking -She is still actively smoking, we discussed smoking cessation. -I strongly encouraged her to stop smoking, she was not very interested.   Plan for today -she will call me back in a few day about her decision on treatment and referral for second opinion -I sent a message for Dr. Barry Dienes to cancel her appointment next week   All questions were answered. The patient knows to call the clinic with any problems, questions or concerns.  I spent 25 minutes counseling the patient face to face. The total time spent in the appointment was 30 minutes and more than 50% was on counseling.     Truitt Merle, MD 04/17/2015  Addendum  Pt called back this morning and decided to pursue palliative care alone, no chemo.   I will see her back in one month for follow up and symptoms management.  Truitt Merle  04/18/2015

## 2015-04-20 NOTE — Telephone Encounter (Signed)
Merceda Elks RN navigator talked with pt.

## 2015-04-25 ENCOUNTER — Telehealth: Payer: Self-pay | Admitting: Internal Medicine

## 2015-04-25 ENCOUNTER — Telehealth: Payer: Self-pay | Admitting: *Deleted

## 2015-04-25 NOTE — Telephone Encounter (Signed)
Hospice called to let you know that the patient went to see Truitt Merle on 04/13/2015 and he diagnosed her with stage 4 pancreatic cancer. She called Hospice today requesting to be moved there because she doesn't want Chemo. They are sending over a medical record release to have records sent to them.

## 2015-04-25 NOTE — Telephone Encounter (Signed)
Received call from [facility] @ [facility] re:  Pt would like to be referred  for hospice.  Juliann Pulse would like to know if it is ok with Dr. Burr Medico, and also would like to have notes indicating that pt will not receive any treatments and appropriate for hospice services. [facility]   Phone    [phone].

## 2015-04-26 NOTE — Telephone Encounter (Signed)
Thu,   Could you call back and let them know that I think hospice is appropriate, please fax my last office note.   Thanks,  Truitt Merle

## 2015-04-26 NOTE — Telephone Encounter (Signed)
Spoke with Marval Regal today and made hospice referral for pt as ok per Dr. Burr Medico.  Informed Juliann Pulse that Dr. Burr Medico will be the attending for updates, and hospice physicians to assist with symptoms management for pt.   Faxed last office notes along with last CT scan results, and demographics to [facility]. [facility]    Phone    [phone]    ;    Fax     [phone]   Or     [phone].

## 2015-04-27 NOTE — Telephone Encounter (Signed)
Appt cancelled

## 2015-04-27 NOTE — Telephone Encounter (Signed)
Ok  I spoke with patient directly.  To keep  March 10 appt  But can cancel the lab appt before . Will decide at visit if need blood test.  bg and bp ok at this time.  [name] please cancel  Only the lab appt . thanks

## 2015-05-02 ENCOUNTER — Other Ambulatory Visit: Payer: Self-pay | Admitting: Hematology

## 2015-05-02 ENCOUNTER — Encounter (INDEPENDENT_AMBULATORY_CARE_PROVIDER_SITE_OTHER): Payer: PPO | Admitting: Ophthalmology

## 2015-05-02 DIAGNOSIS — E11311 Type 2 diabetes mellitus with unspecified diabetic retinopathy with macular edema: Secondary | ICD-10-CM

## 2015-05-02 DIAGNOSIS — H35033 Hypertensive retinopathy, bilateral: Secondary | ICD-10-CM

## 2015-05-02 DIAGNOSIS — H353122 Nonexudative age-related macular degeneration, left eye, intermediate dry stage: Secondary | ICD-10-CM | POA: Diagnosis not present

## 2015-05-02 DIAGNOSIS — E113211 Type 2 diabetes mellitus with mild nonproliferative diabetic retinopathy with macular edema, right eye: Secondary | ICD-10-CM | POA: Diagnosis not present

## 2015-05-02 DIAGNOSIS — I10 Essential (primary) hypertension: Secondary | ICD-10-CM

## 2015-05-02 DIAGNOSIS — H43813 Vitreous degeneration, bilateral: Secondary | ICD-10-CM

## 2015-05-02 DIAGNOSIS — H353211 Exudative age-related macular degeneration, right eye, with active choroidal neovascularization: Secondary | ICD-10-CM | POA: Diagnosis not present

## 2015-05-02 DIAGNOSIS — E113292 Type 2 diabetes mellitus with mild nonproliferative diabetic retinopathy without macular edema, left eye: Secondary | ICD-10-CM | POA: Diagnosis not present

## 2015-05-11 ENCOUNTER — Ambulatory Visit: Payer: PPO | Admitting: Gastroenterology

## 2015-05-11 ENCOUNTER — Other Ambulatory Visit: Payer: PPO

## 2015-05-18 ENCOUNTER — Telehealth: Payer: Self-pay | Admitting: Hematology

## 2015-05-18 ENCOUNTER — Ambulatory Visit (INDEPENDENT_AMBULATORY_CARE_PROVIDER_SITE_OTHER): Payer: PPO | Admitting: Internal Medicine

## 2015-05-18 ENCOUNTER — Encounter: Payer: Self-pay | Admitting: Internal Medicine

## 2015-05-18 ENCOUNTER — Encounter: Payer: Self-pay | Admitting: Hematology

## 2015-05-18 ENCOUNTER — Ambulatory Visit (HOSPITAL_BASED_OUTPATIENT_CLINIC_OR_DEPARTMENT_OTHER): Payer: PPO | Admitting: Hematology

## 2015-05-18 VITALS — BP 162/48 | HR 69 | Temp 97.4°F | Resp 18 | Ht 66.0 in | Wt 111.6 lb

## 2015-05-18 VITALS — BP 178/60 | Temp 98.7°F | Wt 110.9 lb

## 2015-05-18 DIAGNOSIS — R109 Unspecified abdominal pain: Secondary | ICD-10-CM

## 2015-05-18 DIAGNOSIS — E1151 Type 2 diabetes mellitus with diabetic peripheral angiopathy without gangrene: Secondary | ICD-10-CM

## 2015-05-18 DIAGNOSIS — R634 Abnormal weight loss: Secondary | ICD-10-CM | POA: Diagnosis not present

## 2015-05-18 DIAGNOSIS — F17200 Nicotine dependence, unspecified, uncomplicated: Secondary | ICD-10-CM

## 2015-05-18 DIAGNOSIS — C251 Malignant neoplasm of body of pancreas: Secondary | ICD-10-CM | POA: Diagnosis not present

## 2015-05-18 DIAGNOSIS — Z515 Encounter for palliative care: Secondary | ICD-10-CM

## 2015-05-18 DIAGNOSIS — I10 Essential (primary) hypertension: Secondary | ICD-10-CM

## 2015-05-18 DIAGNOSIS — Z720 Tobacco use: Secondary | ICD-10-CM | POA: Diagnosis not present

## 2015-05-18 LAB — POCT GLYCOSYLATED HEMOGLOBIN (HGB A1C): HEMOGLOBIN A1C: 8.1

## 2015-05-18 NOTE — Telephone Encounter (Signed)
per pof to sch pt appt-left pt a message of time & date of appt for 4/21 @ 2
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
## 2015-05-18 NOTE — Patient Instructions (Addendum)
Finish the levaquin . Let medical team know if  Getting low blood sugars and  Medication adjustments . Glad you are eating better . a1c is 8.1 today   Wt Readings from Last 3 Encounters:  05/18/15 110 lb 14.4 oz (50.304 kg)  05/18/15 111 lb 9.6 oz (50.621 kg)  04/17/15 108 lb 14.4 oz (49.397 kg)   Plan rov in 4 months  Or as needed
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
## 2015-05-26 ENCOUNTER — Other Ambulatory Visit: Payer: Self-pay | Admitting: Internal Medicine

## 2015-05-28 DIAGNOSIS — Z720 Tobacco use: Secondary | ICD-10-CM | POA: Diagnosis not present

## 2015-05-28 DIAGNOSIS — C259 Malignant neoplasm of pancreas, unspecified: Secondary | ICD-10-CM | POA: Diagnosis not present

## 2015-05-28 DIAGNOSIS — K769 Liver disease, unspecified: Secondary | ICD-10-CM | POA: Diagnosis not present

## 2015-05-28 NOTE — Telephone Encounter (Signed)
Sent to the pharmacy by e-scribe. 

## 2015-05-31 DIAGNOSIS — C259 Malignant neoplasm of pancreas, unspecified: Secondary | ICD-10-CM | POA: Diagnosis not present

## 2015-05-31 DIAGNOSIS — C787 Secondary malignant neoplasm of liver and intrahepatic bile duct: Secondary | ICD-10-CM | POA: Diagnosis not present

## 2015-06-07 ENCOUNTER — Encounter (INDEPENDENT_AMBULATORY_CARE_PROVIDER_SITE_OTHER): Payer: PPO | Admitting: Ophthalmology

## 2015-06-07 DIAGNOSIS — H35033 Hypertensive retinopathy, bilateral: Secondary | ICD-10-CM

## 2015-06-07 DIAGNOSIS — H353211 Exudative age-related macular degeneration, right eye, with active choroidal neovascularization: Secondary | ICD-10-CM

## 2015-06-07 DIAGNOSIS — H353122 Nonexudative age-related macular degeneration, left eye, intermediate dry stage: Secondary | ICD-10-CM | POA: Diagnosis not present

## 2015-06-07 DIAGNOSIS — E11311 Type 2 diabetes mellitus with unspecified diabetic retinopathy with macular edema: Secondary | ICD-10-CM

## 2015-06-07 DIAGNOSIS — E113292 Type 2 diabetes mellitus with mild nonproliferative diabetic retinopathy without macular edema, left eye: Secondary | ICD-10-CM | POA: Diagnosis not present

## 2015-06-07 DIAGNOSIS — I10 Essential (primary) hypertension: Secondary | ICD-10-CM | POA: Diagnosis not present

## 2015-06-07 DIAGNOSIS — E113211 Type 2 diabetes mellitus with mild nonproliferative diabetic retinopathy with macular edema, right eye: Secondary | ICD-10-CM | POA: Diagnosis not present

## 2015-06-07 DIAGNOSIS — H43813 Vitreous degeneration, bilateral: Secondary | ICD-10-CM

## 2015-06-19 ENCOUNTER — Telehealth: Payer: Self-pay | Admitting: Family Medicine

## 2015-06-19 LAB — BASIC METABOLIC PANEL
BUN: 22 mg/dL — AB (ref 4–21)
Creatinine: 0.5 mg/dL (ref <=1.1)
Glucose: 345 mg/dL
POTASSIUM: 4.3 mmol/L (ref 3.4–5.3)
Sodium: 130 mmol/L — AB (ref 137–147)

## 2015-06-19 LAB — CBC AND DIFFERENTIAL
HEMATOCRIT: 36 % (ref 36–46)
Hemoglobin: 12.8 g/dL (ref 12.0–16.0)
Platelets: 116 10*3/uL — AB (ref 150–399)
WBC: 3.7 10^3/mL

## 2015-06-19 LAB — HEPATIC FUNCTION PANEL
ALT: 33 U/L (ref 7–35)
AST: 23 U/L (ref 13–35)
Alkaline Phosphatase: 105 U/L (ref 25–125)
Bilirubin, Total: 0.3 mg/dL

## 2015-06-19 NOTE — Telephone Encounter (Signed)
Left a message for Linda to call back. 

## 2015-06-19 NOTE — Telephone Encounter (Signed)
If taking  glimepramide  2 mg per day   Can increase to 3 mg per day   Also we can use   Short acting  Insulin  For very high bgs  but she hasn't been on this before .and would need rx for this   Can add  novolog or humalog ( short acting )  ( which ever  Is covered by her insurance 4 units  If  bg over 300    Repeat bg  3-4  hours  If insulin given  Checking Bg 3 x per day   Send in readings

## 2015-06-19 NOTE — Telephone Encounter (Signed)
Jordan [name] (NP) from [facility] called to report that the pt is now trying chemo.  Had her first dose 1 week ago.  While there her bg was 520.  Pt said that she had not taken her medication.  Jordan [name] would like to try a daily dose of dexamethasone but pt is refusing because she is afraid of high bg readings.  Jordan [name] would like for diabetes medication to be adjusted so she may take steroid.  Please advise.  Thanks! Pt has appt today at 2

## 2015-06-19 NOTE — Telephone Encounter (Signed)
Spoke to [place].  If the patient decided to try steroid than this will be her directions.  Vaughan Basta will talk to her today about the steroid.  Advised the pt will need to call her insurance to find out which short acting insulin is covered.  Advised for the pt to call me to go over directions again if she decides to start steroid.

## 2015-06-21 DIAGNOSIS — R630 Anorexia: Secondary | ICD-10-CM | POA: Diagnosis not present

## 2015-06-21 DIAGNOSIS — C259 Malignant neoplasm of pancreas, unspecified: Secondary | ICD-10-CM | POA: Diagnosis not present

## 2015-06-21 LAB — HEPATIC FUNCTION PANEL
ALT: 33 U/L (ref 7–35)
AST: 33 U/L (ref 13–35)
Alkaline Phosphatase: 127 U/L — AB (ref 25–125)
Bilirubin, Total: 0.4 mg/dL

## 2015-06-21 LAB — CBC AND DIFFERENTIAL
HCT: 36 % (ref 36–46)
HEMOGLOBIN: 12.9 g/dL (ref 12.0–16.0)
Platelets: 163 10*3/uL (ref 150–399)
WBC: 3.6 10*3/mL

## 2015-06-21 LAB — BASIC METABOLIC PANEL
BUN: 17 mg/dL (ref 4–21)
Creatinine: 0.5 mg/dL (ref <=1.1)
Glucose: 294 mg/dL
POTASSIUM: 3.9 mmol/L (ref 3.4–5.3)
SODIUM: 134 mmol/L — AB (ref 137–147)

## 2015-06-25 ENCOUNTER — Telehealth: Payer: Self-pay | Admitting: Hematology

## 2015-06-25 NOTE — Telephone Encounter (Signed)
pt cld to CX appt-no r/s @ this time

## 2015-06-29 ENCOUNTER — Ambulatory Visit: Payer: PPO | Admitting: Hematology

## 2015-07-11 ENCOUNTER — Other Ambulatory Visit: Payer: Self-pay | Admitting: Internal Medicine

## 2015-07-12 ENCOUNTER — Encounter (INDEPENDENT_AMBULATORY_CARE_PROVIDER_SITE_OTHER): Payer: PPO | Admitting: Ophthalmology

## 2015-07-13 NOTE — Telephone Encounter (Signed)
Sent to the pharmacy by e-scribe. 

## 2015-07-19 ENCOUNTER — Encounter (INDEPENDENT_AMBULATORY_CARE_PROVIDER_SITE_OTHER): Payer: PPO | Admitting: Ophthalmology

## 2015-07-19 DIAGNOSIS — H35033 Hypertensive retinopathy, bilateral: Secondary | ICD-10-CM

## 2015-07-19 DIAGNOSIS — H43813 Vitreous degeneration, bilateral: Secondary | ICD-10-CM

## 2015-07-19 DIAGNOSIS — H353122 Nonexudative age-related macular degeneration, left eye, intermediate dry stage: Secondary | ICD-10-CM | POA: Diagnosis not present

## 2015-07-19 DIAGNOSIS — E113292 Type 2 diabetes mellitus with mild nonproliferative diabetic retinopathy without macular edema, left eye: Secondary | ICD-10-CM

## 2015-07-19 DIAGNOSIS — E113391 Type 2 diabetes mellitus with moderate nonproliferative diabetic retinopathy without macular edema, right eye: Secondary | ICD-10-CM | POA: Diagnosis not present

## 2015-07-19 DIAGNOSIS — E11319 Type 2 diabetes mellitus with unspecified diabetic retinopathy without macular edema: Secondary | ICD-10-CM | POA: Diagnosis not present

## 2015-07-19 DIAGNOSIS — H353211 Exudative age-related macular degeneration, right eye, with active choroidal neovascularization: Secondary | ICD-10-CM

## 2015-07-19 DIAGNOSIS — H2513 Age-related nuclear cataract, bilateral: Secondary | ICD-10-CM

## 2015-07-19 DIAGNOSIS — I10 Essential (primary) hypertension: Secondary | ICD-10-CM | POA: Diagnosis not present

## 2015-07-29 ENCOUNTER — Other Ambulatory Visit: Payer: Self-pay | Admitting: Internal Medicine

## 2015-07-30 NOTE — Telephone Encounter (Signed)
Sent to the pharmacy by e-scribe. 

## 2015-08-06 ENCOUNTER — Other Ambulatory Visit: Payer: Self-pay | Admitting: Internal Medicine

## 2015-08-07 ENCOUNTER — Other Ambulatory Visit: Payer: Self-pay | Admitting: General Practice

## 2015-08-07 MED ORDER — AMLODIPINE BESYLATE 10 MG PO TABS: 10.0000 mg | ORAL_TABLET | Freq: Every day | ORAL | Status: AC

## 2015-08-07 MED ORDER — LISINOPRIL 20 MG PO TABS: 20.0000 mg | ORAL_TABLET | Freq: Every day | ORAL | Status: AC

## 2015-08-23 ENCOUNTER — Encounter: Payer: Self-pay | Admitting: Family Medicine

## 2015-08-23 ENCOUNTER — Encounter (INDEPENDENT_AMBULATORY_CARE_PROVIDER_SITE_OTHER): Payer: PPO | Admitting: Ophthalmology

## 2015-08-23 ENCOUNTER — Ambulatory Visit: Payer: PPO | Admitting: Internal Medicine

## 2015-08-24 LAB — BASIC METABOLIC PANEL
BUN: 27 mg/dL — AB (ref 4–21)
Creatinine: 0.7 mg/dL (ref <=1.1)
GLUCOSE: 366 mg/dL
POTASSIUM: 4 mmol/L (ref 3.4–5.3)
Sodium: 137 mmol/L (ref 137–147)

## 2015-08-24 LAB — HEPATIC FUNCTION PANEL
ALT: 22 U/L (ref 7–35)
AST: 16 U/L (ref 13–35)
Alkaline Phosphatase: 260 U/L — AB (ref 25–125)
BILIRUBIN, TOTAL: 0.4 mg/dL

## 2015-08-24 LAB — TSH: TSH: 3.77 u[IU]/mL (ref <=5.90)

## 2015-08-26 ENCOUNTER — Other Ambulatory Visit: Payer: Self-pay | Admitting: Internal Medicine

## 2015-08-27 ENCOUNTER — Encounter (INDEPENDENT_AMBULATORY_CARE_PROVIDER_SITE_OTHER): Payer: PPO | Admitting: Ophthalmology

## 2015-08-27 DIAGNOSIS — E113213 Type 2 diabetes mellitus with mild nonproliferative diabetic retinopathy with macular edema, bilateral: Secondary | ICD-10-CM

## 2015-08-27 DIAGNOSIS — H43813 Vitreous degeneration, bilateral: Secondary | ICD-10-CM | POA: Diagnosis not present

## 2015-08-27 DIAGNOSIS — H35033 Hypertensive retinopathy, bilateral: Secondary | ICD-10-CM

## 2015-08-27 DIAGNOSIS — E11311 Type 2 diabetes mellitus with unspecified diabetic retinopathy with macular edema: Secondary | ICD-10-CM

## 2015-08-27 DIAGNOSIS — I10 Essential (primary) hypertension: Secondary | ICD-10-CM

## 2015-08-27 DIAGNOSIS — H353231 Exudative age-related macular degeneration, bilateral, with active choroidal neovascularization: Secondary | ICD-10-CM

## 2015-08-28 ENCOUNTER — Other Ambulatory Visit: Payer: Self-pay | Admitting: General Practice

## 2015-08-28 MED ORDER — METFORMIN HCL ER 500 MG PO TB24: 1000.0000 mg | ORAL_TABLET | Freq: Two times a day (BID) | ORAL | Status: AC

## 2015-08-30 ENCOUNTER — Other Ambulatory Visit (INDEPENDENT_AMBULATORY_CARE_PROVIDER_SITE_OTHER): Payer: PPO | Admitting: Ophthalmology

## 2015-08-30 DIAGNOSIS — H318 Other specified disorders of choroid: Secondary | ICD-10-CM

## 2015-08-31 ENCOUNTER — Encounter: Payer: Self-pay | Admitting: Family Medicine

## 2015-09-03 NOTE — Progress Notes (Signed)
Document opened and reviewed for OV but appt  canceled same day .  

## 2015-09-04 ENCOUNTER — Encounter: Payer: PPO | Admitting: Internal Medicine

## 2015-09-24 ENCOUNTER — Encounter (INDEPENDENT_AMBULATORY_CARE_PROVIDER_SITE_OTHER): Payer: PPO | Admitting: Ophthalmology

## 2015-09-24 DIAGNOSIS — E113212 Type 2 diabetes mellitus with mild nonproliferative diabetic retinopathy with macular edema, left eye: Secondary | ICD-10-CM | POA: Diagnosis not present

## 2015-09-24 DIAGNOSIS — H43813 Vitreous degeneration, bilateral: Secondary | ICD-10-CM | POA: Diagnosis not present

## 2015-09-24 DIAGNOSIS — H35033 Hypertensive retinopathy, bilateral: Secondary | ICD-10-CM | POA: Diagnosis not present

## 2015-09-24 DIAGNOSIS — H353231 Exudative age-related macular degeneration, bilateral, with active choroidal neovascularization: Secondary | ICD-10-CM

## 2015-09-24 DIAGNOSIS — I10 Essential (primary) hypertension: Secondary | ICD-10-CM

## 2015-09-24 DIAGNOSIS — E113311 Type 2 diabetes mellitus with moderate nonproliferative diabetic retinopathy with macular edema, right eye: Secondary | ICD-10-CM | POA: Diagnosis not present

## 2015-09-24 DIAGNOSIS — E11311 Type 2 diabetes mellitus with unspecified diabetic retinopathy with macular edema: Secondary | ICD-10-CM

## 2015-10-09 ENCOUNTER — Other Ambulatory Visit: Payer: Self-pay | Admitting: Internal Medicine

## 2015-10-11 NOTE — Telephone Encounter (Signed)
Spoke to the pt by telephone.  She is taking 1 whole tab.  Also scheduled follow up.  Sent to the pharmacy by e-scribe.
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
## 2015-10-26 ENCOUNTER — Ambulatory Visit: Payer: PPO | Admitting: Internal Medicine

## 2015-10-29 ENCOUNTER — Encounter (INDEPENDENT_AMBULATORY_CARE_PROVIDER_SITE_OTHER): Payer: PPO | Admitting: Ophthalmology

## 2015-11-12 ENCOUNTER — Other Ambulatory Visit: Payer: Self-pay | Admitting: Internal Medicine

## 2015-11-13 ENCOUNTER — Other Ambulatory Visit: Payer: Self-pay | Admitting: Internal Medicine

## 2015-11-14 ENCOUNTER — Telehealth: Payer: Self-pay

## 2015-11-14 NOTE — Telephone Encounter (Signed)
Received PA request from [facility] for Metformin ER 500mg . PA submitted & is pending. [id number]

## 2016-01-17 ENCOUNTER — Encounter: Payer: Self-pay | Admitting: Gastroenterology

## 2016-03-10 DEATH — deceased

## 2016-04-22 IMAGING — CR DG CHEST 2V
2 series · 2 of 2 positions shown · non-contrast
Comparison: Chest radiograph 09/23/2013

CLINICAL DATA: Smoker.  Recent weight loss.  History of COPD.

EXAM:
CHEST  2 VIEW

[view not recorded (1 of 2)]
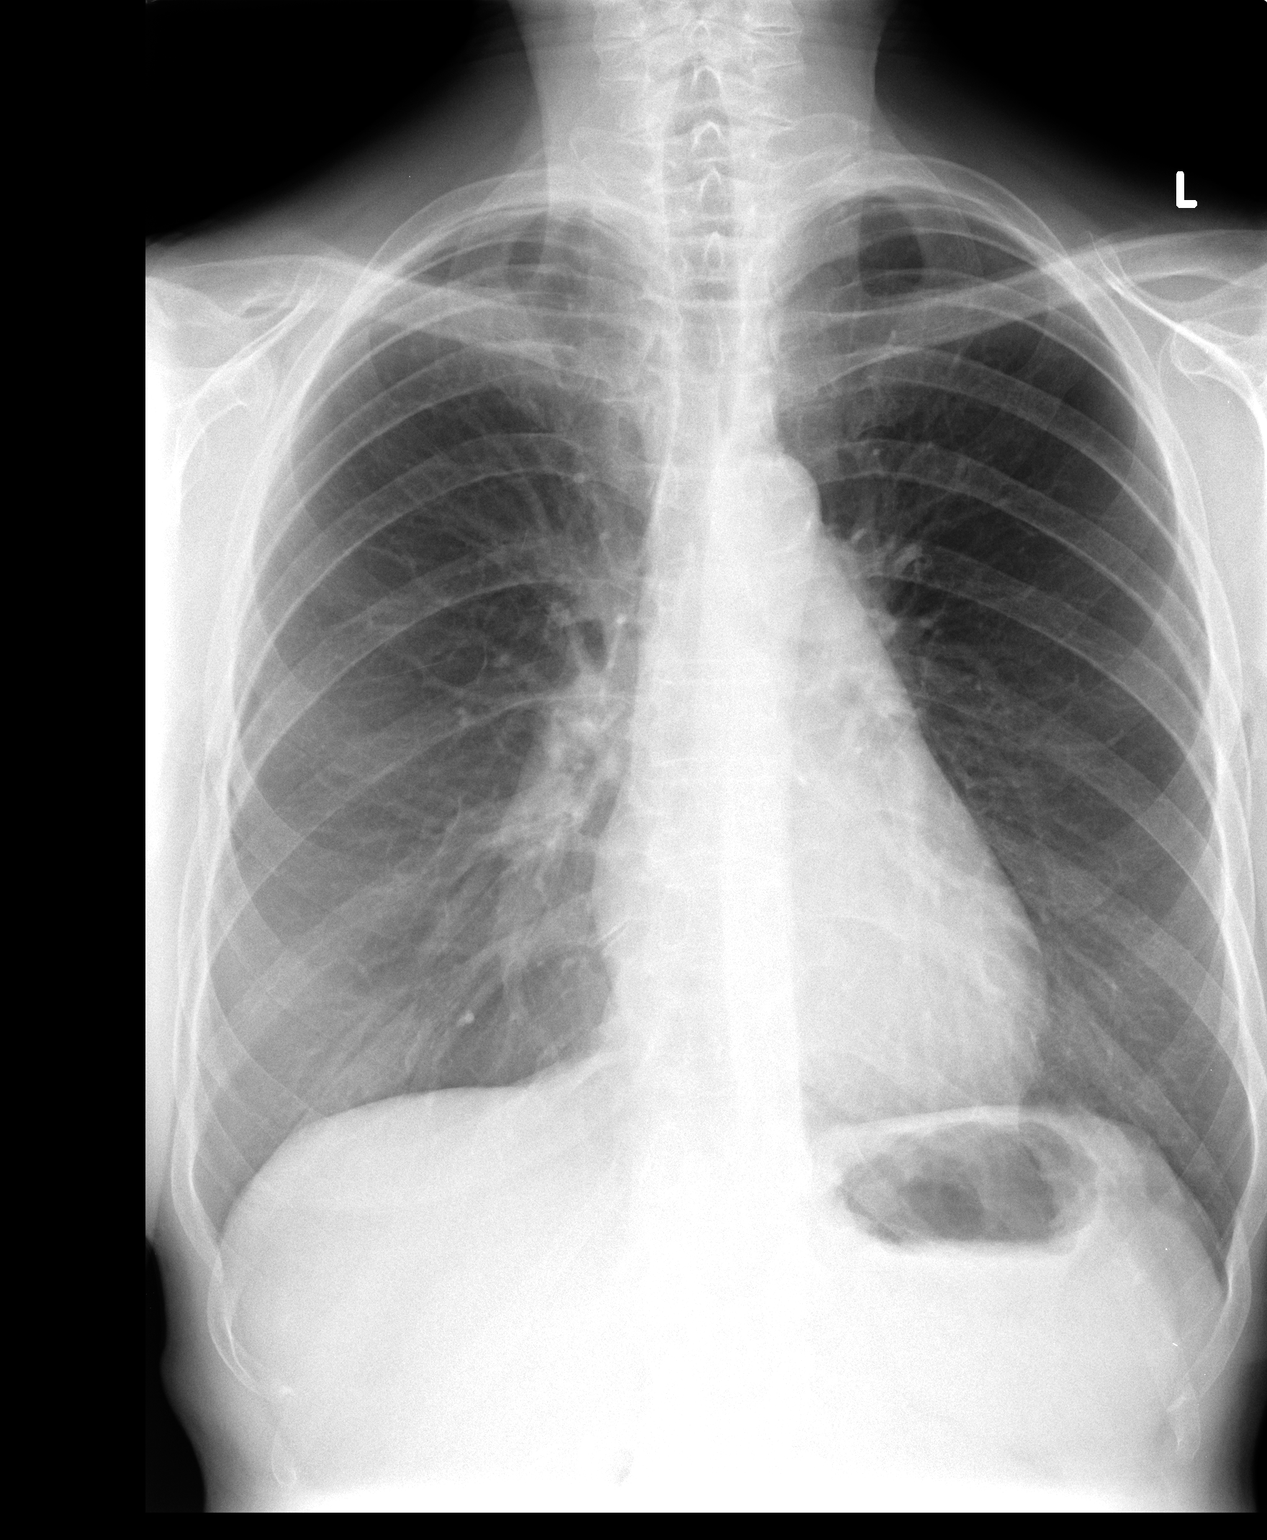

[view not recorded (2 of 2)]
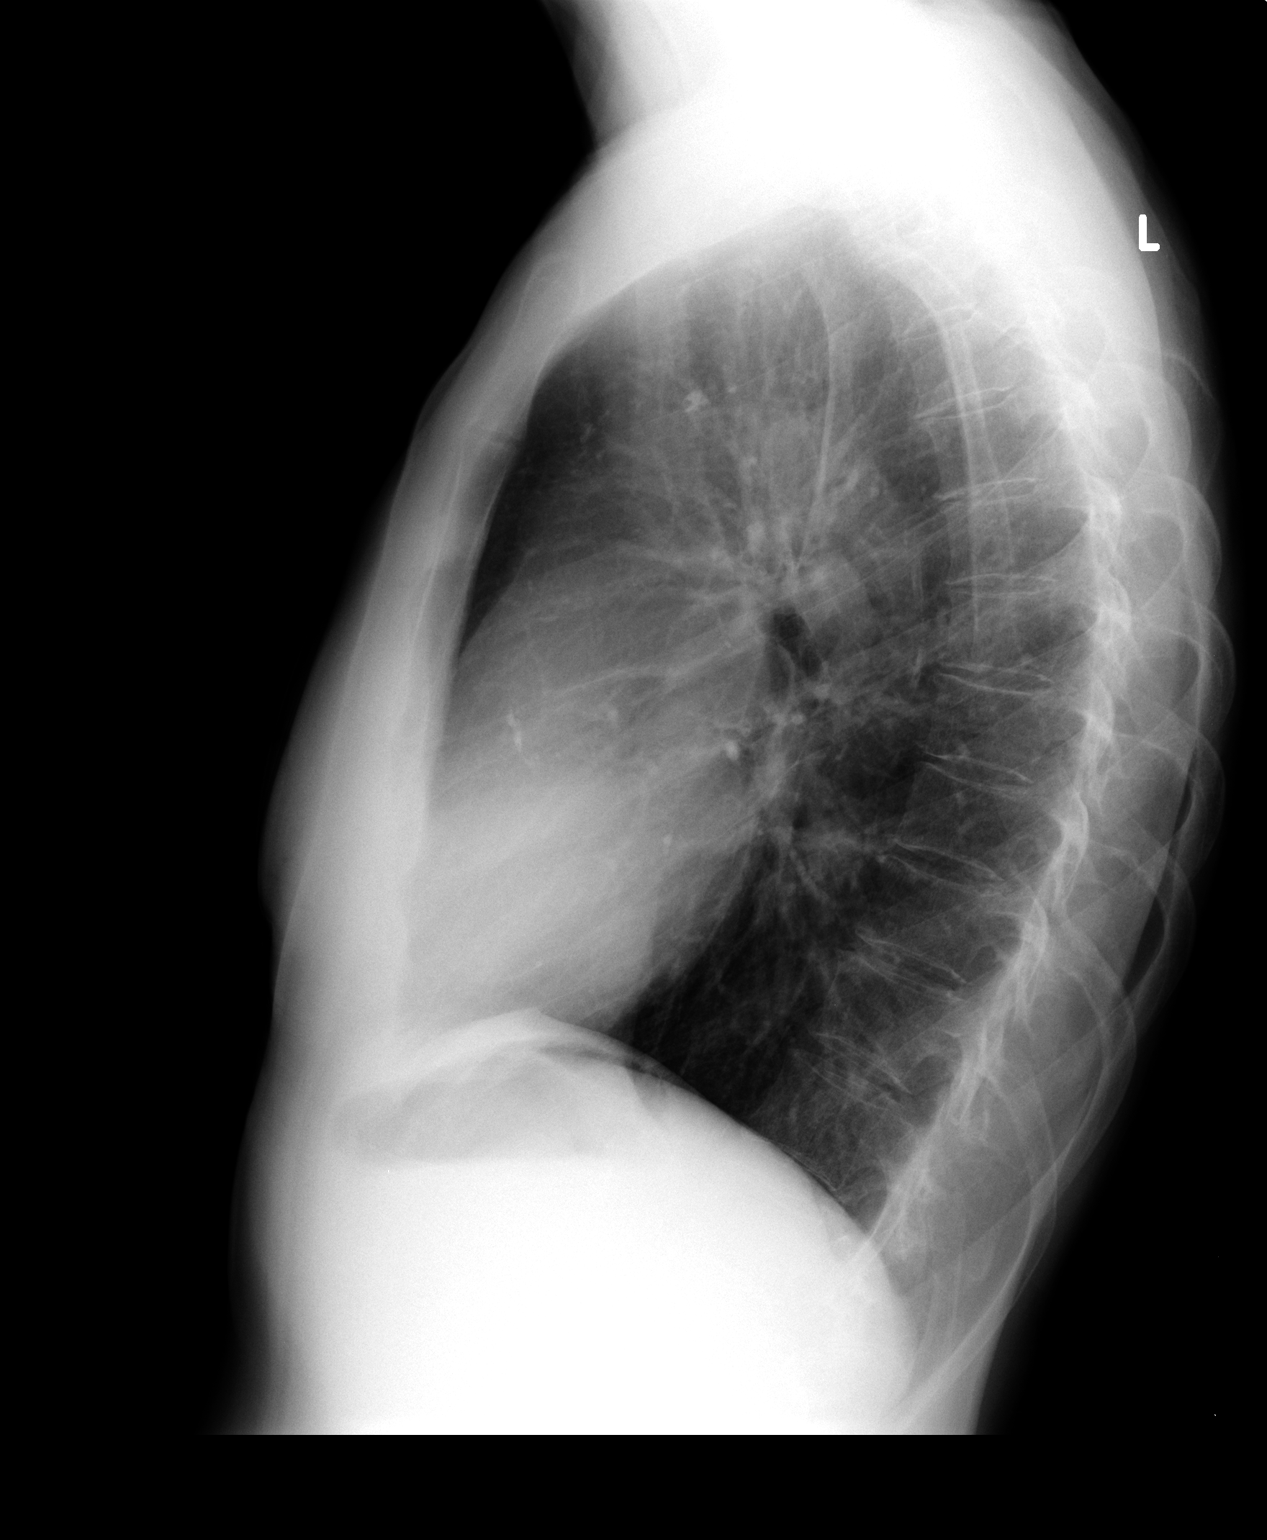

[2 of 2 positions shown; findings below may reference images not displayed]

FINDINGS: Normal cardiac and mediastinal contours. No consolidative pulmonary
opacities. No pleural effusion or pneumothorax. Regional skeleton
unremarkable.
IMPRESSION: No acute cardiopulmonary process.

## 2016-11-28 ENCOUNTER — Encounter: Payer: Self-pay | Admitting: Internal Medicine
